# Patient Record
Sex: Male | Born: 1950 | Race: White | Hispanic: No | Marital: Single | Smoking: Current every day smoker
Health system: Southern US, Community
[De-identification: ages and names within clinical notes are randomized; demographics above are authoritative.]

## PROBLEM LIST (undated history)

## (undated) DIAGNOSIS — J189 Pneumonia, unspecified organism: Secondary | ICD-10-CM

## (undated) DIAGNOSIS — K4090 Unilateral inguinal hernia, without obstruction or gangrene, not specified as recurrent: Secondary | ICD-10-CM

## (undated) DIAGNOSIS — Z85828 Personal history of other malignant neoplasm of skin: Secondary | ICD-10-CM

## (undated) DIAGNOSIS — H539 Unspecified visual disturbance: Secondary | ICD-10-CM

## (undated) DIAGNOSIS — C4490 Unspecified malignant neoplasm of skin, unspecified: Secondary | ICD-10-CM

## (undated) DIAGNOSIS — I499 Cardiac arrhythmia, unspecified: Secondary | ICD-10-CM

## (undated) DIAGNOSIS — H5440 Blindness, one eye, unspecified eye: Secondary | ICD-10-CM

## (undated) DIAGNOSIS — M1990 Unspecified osteoarthritis, unspecified site: Secondary | ICD-10-CM

## (undated) HISTORY — DX: Unspecified visual disturbance: H53.9

## (undated) HISTORY — PX: TONSILLECTOMY: SUR1361

## (undated) HISTORY — DX: Unspecified malignant neoplasm of skin, unspecified: C44.90

## (undated) HISTORY — PX: EYE SURGERY: SHX253

---

## 1976-09-15 HISTORY — PX: KNEE CARTILAGE SURGERY: SHX688

## 2000-10-15 ENCOUNTER — Other Ambulatory Visit: Admission: RE | Admit: 2000-10-15 | Discharge: 2000-10-15 | Payer: Self-pay | Admitting: Internal Medicine

## 2000-10-15 ENCOUNTER — Encounter (INDEPENDENT_AMBULATORY_CARE_PROVIDER_SITE_OTHER): Payer: Self-pay | Admitting: Specialist

## 2003-12-26 ENCOUNTER — Ambulatory Visit (HOSPITAL_COMMUNITY): Admission: RE | Admit: 2003-12-26 | Discharge: 2003-12-26 | Payer: Self-pay | Admitting: Orthopedic Surgery

## 2004-10-14 ENCOUNTER — Ambulatory Visit: Payer: Self-pay | Admitting: Internal Medicine

## 2006-12-11 ENCOUNTER — Ambulatory Visit: Payer: Self-pay | Admitting: Internal Medicine

## 2006-12-11 LAB — CONVERTED CEMR LAB
ALT: 31 units/L (ref 0–40)
BUN: 18 mg/dL (ref 6–23)
Basophils Relative: 0.5 % (ref 0.0–1.0)
Bilirubin, Direct: 0.2 mg/dL (ref 0.0–0.3)
CO2: 31 meq/L (ref 19–32)
Chloride: 106 meq/L (ref 96–112)
GFR calc Af Amer: 99 mL/min
HDL: 41.5 mg/dL (ref >39.0)
Hemoglobin, Urine: NEGATIVE
Hemoglobin: 16.1 g/dL (ref 13.0–17.0)
LDL Cholesterol: 117 mg/dL — ABNORMAL HIGH (ref 0–99)
Lymphocytes Relative: 35.5 % (ref 12.0–46.0)
MCHC: 33.9 g/dL (ref 30.0–36.0)
MCV: 92.4 fL (ref 78.0–100.0)
Monocytes Absolute: 1 10*3/uL — ABNORMAL HIGH (ref 0.2–0.7)
Monocytes Relative: 14.2 % — ABNORMAL HIGH (ref 3.0–11.0)
Neutrophils Relative %: 49.4 % (ref 43.0–77.0)
Nitrite: NEGATIVE
PSA: 1.28 ng/mL (ref 0.10–4.00)
Platelets: 199 10*3/uL (ref 150–400)
RBC: 5.13 M/uL (ref 4.22–5.81)
RDW: 12.2 % (ref 11.5–14.6)
Sodium: 141 meq/L (ref 135–145)
Specific Gravity, Urine: 1.025 (ref 1.000–1.03)
Total Protein, Urine: NEGATIVE mg/dL
Total Protein: 7 g/dL (ref 6.0–8.3)
Triglycerides: 129 mg/dL (ref 0–149)
Urobilinogen, UA: 0.2 (ref 0.0–1.0)
pH: 5.5 (ref 5.0–8.0)

## 2006-12-18 ENCOUNTER — Ambulatory Visit: Payer: Self-pay | Admitting: Internal Medicine

## 2009-09-26 ENCOUNTER — Encounter (INDEPENDENT_AMBULATORY_CARE_PROVIDER_SITE_OTHER): Payer: Self-pay | Admitting: *Deleted

## 2010-04-12 ENCOUNTER — Telehealth: Payer: Self-pay | Admitting: Internal Medicine

## 2010-07-26 ENCOUNTER — Encounter (INDEPENDENT_AMBULATORY_CARE_PROVIDER_SITE_OTHER): Payer: Self-pay | Admitting: *Deleted

## 2010-08-01 ENCOUNTER — Encounter (INDEPENDENT_AMBULATORY_CARE_PROVIDER_SITE_OTHER): Payer: Self-pay | Admitting: *Deleted

## 2010-08-02 ENCOUNTER — Ambulatory Visit: Payer: Self-pay | Admitting: Internal Medicine

## 2010-08-13 ENCOUNTER — Ambulatory Visit: Payer: Self-pay | Admitting: Internal Medicine

## 2010-08-16 ENCOUNTER — Encounter: Payer: Self-pay | Admitting: Internal Medicine

## 2010-10-17 NOTE — Letter (Signed)
Summary: [facility] Instructions  [facility]  [address] [place], [place] [address]   Phone: [phone]  Fax: [phone]       Jamie York    December 04, 1950    MRN: [id number]        Procedure Day [name]:  Jamie York  08/13/10      Arrival Time:  3:00PM      Procedure Time:  4:00PM     Location of Procedure:                    _X _  [facility] (4th Floor)                      PREPARATION FOR COLONOSCOPY WITH MOVIPREP   Starting 5 days prior to your procedure 08/08/10 do not eat nuts, seeds, popcorn, corn, beans, peas,  salads, or any raw vegetables.  Do not take any fiber supplements (e.g. Metamucil, Citrucel, and Benefiber).  THE DAY BEFORE YOUR PROCEDURE         DATE: 08/12/10  DAY: MONDAY  1.  Drink clear liquids the entire day-NO SOLID FOOD  2.  Do not drink anything colored red or purple.  Avoid juices with pulp.  No orange juice.  3.  Drink at least 64 oz. (8 glasses) of fluid/clear liquids during the day to prevent dehydration and help the prep work efficiently.  CLEAR LIQUIDS INCLUDE: Water Jello Ice Popsicles Tea (sugar ok, no milk/cream) Powdered fruit flavored drinks Coffee (sugar ok, no milk/cream) Gatorade Juice: apple, white grape, white cranberry  Lemonade Clear bullion, consomm, broth Carbonated beverages (any kind) Strained chicken noodle soup Hard Candy                             4.  In the morning, mix first dose of MoviPrep solution:    Empty 1 Pouch A and 1 Pouch B into the disposable container    Add lukewarm drinking water to the top line of the container. Mix to dissolve    Refrigerate (mixed solution should be used within 24 hrs)  5.  Begin drinking the prep at 5:00 p.m. The MoviPrep container is divided by 4 marks.   Every 15 minutes drink the solution down to the next mark (approximately 8 oz) until the full liter is complete.   6.  Follow completed prep with 16 oz of clear liquid of your choice  (Nothing red or purple).  Continue to drink clear liquids until bedtime.  7.  Before going to bed, mix second dose of MoviPrep solution:    Empty 1 Pouch A and 1 Pouch B into the disposable container    Add lukewarm drinking water to the top line of the container. Mix to dissolve    Refrigerate  THE DAY OF YOUR PROCEDURE      DATE: 08/13/10  DAY: TUESDAY  Beginning at 11:00AM (5 hours before procedure):         1. Every 15 minutes, drink the solution down to the next mark (approx 8 oz) until the full liter is complete.  2. Follow completed prep with 16 oz. of clear liquid of your choice.    3. You may drink clear liquids until 2:00PM (2 HOURS BEFORE PROCEDURE).   MEDICATION INSTRUCTIONS  Unless otherwise instructed, you should take regular prescription medications with a small sip of water   as early as possible the morning  of your procedure.         OTHER INSTRUCTIONS  You will need a responsible adult at least 62 years of age to accompany you and drive you home.   This person must remain in the waiting room during your procedure.  Wear loose fitting clothing that is easily removed.  Leave jewelry and other valuables at home.  However, you may wish to bring a book to read or  an iPod/MP3 player to listen to music as you wait for your procedure to start.  Remove all body piercing jewelry and leave at home.  Total time from sign-in until discharge is approximately 2-3 hours.  You should go home directly after your procedure and rest.  You can resume normal activities the  day after your procedure.  The day of your procedure you should not:   Drive   Make legal decisions   Operate machinery   Drink alcohol   Return to work  You will receive specific instructions about eating, activities and medications before you leave.    The above instructions have been reviewed and explained to me by   Wyona Almas RN  August 02, 2010 11:04 AM     I fully  understand and can verbalize these instructions _____________________________ Date _________

## 2010-10-17 NOTE — Progress Notes (Signed)
Summary: Schedule Colonoscopy   Phone Note Outgoing Call Call back at Home Phone [phone]   Call placed by: Harlow Mares CMA Duncan Dull),  April 12, 2010 3:00 PM Call placed to: Patient Summary of Call: spoke to a child who said the pt was at work and the other adult is in the shower, I advised her I would call back later to speak to the pt. The patient is due for a colonoscopy, hyperplastic polyps 2002 Initial call taken by: Harlow Mares CMA Duncan Dull),  April 12, 2010 3:04 PM  Follow-up for Phone Call        patients wife answered the phone and she states that the patient work nights and he is sleeping and she will let him know when he wakes up that he is due and she will give him the number and tell him to know.  Follow-up by: Harlow Mares CMA Duncan Dull),  April 25, 2010 9:16 AM

## 2010-10-17 NOTE — Procedures (Signed)
Summary: Colonoscopy  Patient: Jamie York Note: All result statuses are Final unless otherwise noted.  Tests: (1) Colonoscopy (COL)   COL Colonoscopy           DONE     [facility]     [address]     [place], [place]  [address]           COLONOSCOPY PROCEDURE REPORT           PATIENT:  Jamie York, Jamie York  MR#:  [id number]     BIRTHDATE:  1950/12/01, 58 yrs. old  GENDER:  male     ENDOSCOPIST:  Wilhemina Bonito. Eda Keys, MD     REF. BY:  Screening Recall, M.D.     PROCEDURE DATE:  08/13/2010     PROCEDURE:  Colonoscopy with snare polypectomy x 1     ASA CLASS:  Class II     INDICATIONS:  Routine Risk Screening HPP on index exam 09-2000     MEDICATIONS:   Fentanyl 50 mcg IV, Versed 8 mg IV           DESCRIPTION OF PROCEDURE:   After the risks benefits and     alternatives of the procedure were thoroughly explained, informed     consent was obtained.  Digital rectal exam was performed and     revealed no abnormalities.   The LB 180AL [id number] endoscope was     introduced through the anus and advanced to the cecum, which was     identified by both the appendix and ileocecal valve, without     limitations.Time to cecum = 1:26 min. The quality of the prep was     excellent, using MoviPrep.  The instrument was then slowly     withdrawn (time = 9:06 min) as the colon was fully examined.     <<PROCEDUREIMAGES>>           FINDINGS:  A diminutive polyp was found in the rectum. Polyp was     snared without cautery. Retrieval was successful. snare polyp     This was otherwise a normal examination of the colon.   Retroflexed     views in the rectum revealed internal hemorrhoids.    The scope     was then withdrawn from the patient and the procedure completed.           COMPLICATIONS:  None     ENDOSCOPIC IMPRESSION:     1) Diminutive polyp in the rectum - removed     2) Otherwise normal examination     3) Internal hemorrhoids     RECOMMENDATIONS:     1) Repeat colonoscopy in 5 years  if polyp adenomatous; otherwise     10 years           ______________________________     Wilhemina Bonito. Eda Keys, MD           CC:  Corwin Levins, MD; The Patient           n.     eSIGNED:   Wilhemina Bonito. Eda Keys at 08/13/2010 04:11 PM           Enis Slipper, [id number]  Note: An exclamation mark (!) indicates a result that was not dispersed into the flowsheet. Document Creation Date: 08/13/2010 4:11 PM _______________________________________________________________________  (1) Order result status: Final Collection or observation date-time: 08/13/2010 16:07 Requested date-time:  Receipt date-time:  Reported date-time:  Referring Physician:   Ordering Physician: Jonny Ruiz  Eda Keys [phone]) Specimen Source:  Source: Launa Grill Order Number: [id number] Lab site:   Appended Document: Colonoscopy     Procedures Next Due Date:    Colonoscopy: 07/2020

## 2010-10-17 NOTE — Letter (Signed)
Summary: Pre Visit Letter Revised  [facility]  [address] [place], [place] [address]   Phone: [phone]  Fax: [phone]        07/26/2010 MRN: [id number] [facility] [address] [place], [place]  [address]             Procedure Date:  08/13/2010   Welcome to the Gastroenterology Division at [facility].    You are scheduled to see a nurse for your pre-procedure visit on 08/02/2010 at 10:30AM on the 3rd floor at [facility], [address]  We ask that you try to arrive at our office 15 minutes prior to your appointment time to allow for check-in.  Please take a minute to review the attached form.  If you answer "Yes" to one or more of the questions on the first page, we ask that you call the person listed at your earliest opportunity.  If you answer "No" to all of the questions, please complete the rest of the form and bring it to your appointment.    Your nurse visit will consist of discussing your medical and surgical history, your immediate family medical history, and your medications.   If you are unable to list all of your medications on the form, please bring the medication bottles to your appointment and we will list them.  We will need to be aware of both prescribed and over the counter drugs.  We will need to know exact dosage information as well.    Please be prepared to read and sign documents such as consent forms, a financial agreement, and acknowledgement forms.  If necessary, and with your consent, a friend or relative is welcome to sit-in on the nurse visit with you.  Please bring your insurance card so that we may make a copy of it.  If your insurance requires a referral to see a specialist, please bring your referral form from your primary care physician.  No co-pay is required for this nurse visit.     If you cannot keep your appointment, please call [phone] to cancel or reschedule prior to your appointment date.  This  allows [place] the opportunity to schedule an appointment for another patient in need of care.    Thank you for choosing [facility] for your medical needs.  We appreciate the opportunity to care for you.  Please visit [place] at our website  to learn more about our practice.  Sincerely, The Gastroenterology Division

## 2010-10-17 NOTE — Miscellaneous (Signed)
Summary: [facility] Previsit/prep  Clinical Lists Changes  Medications: Added new medication of MOVIPREP 100 GM  SOLR (PEG-KCL-NACL-NASULF-NA ASC-C) As per prep instructions. - Signed Rx of MOVIPREP 100 GM  SOLR (PEG-KCL-NACL-NASULF-NA ASC-C) As per prep instructions.;  #1 x 0;  Signed;  Entered by: Wyona Almas RN;  Authorized by: Hilarie Fredrickson MD;  Method used: Electronically to [facility] [phone]*, [address] [place], [place]  [address], Ph: [phone] or [phone], Fax: [phone] Observations: Added new observation of NKA: T (08/02/2010 10:31)    Prescriptions: MOVIPREP 100 GM  SOLR (PEG-KCL-NACL-NASULF-NA ASC-C) As per prep instructions.  #1 x 0   Entered by:   Wyona Almas RN   Authorized by:   Hilarie Fredrickson MD   Signed by:   Wyona Almas RN on 08/02/2010   Method used:   [facility] to        [facility] [phone][facility] (retail)       [address]       [place], [place]  [address]       Ph: [phone] or [phone]       Fax: [phone]   RxID:   [id number]   Appended Document: Colonoscopy     Procedures Next Due Date:    Colonoscopy: 07/2020

## 2010-10-17 NOTE — Letter (Signed)
Summary: Colonoscopy Letter  [facility]  [address] [place], [place] [address]   Phone: [phone]  Fax: [phone]      September 26, 2009 MRN: [id number]   Jamie York [address] [place], [place]  [address]   Dear [name],   According to your medical record, it is time for you to schedule a Colonoscopy. The American Cancer Society recommends this procedure as a method to detect early colon cancer. Patients with a family history of colon cancer, or a personal history of colon polyps or inflammatory bowel disease are at increased risk.  This letter has beeen generated based on the recommendations made at the time of your procedure. If you feel that in your particular situation this may no longer apply, please contact our office.  Please call our office at [phone] to schedule this appointment or to update your records at your earliest convenience.  Thank you for cooperating with [place] to provide you with the very best care possible.   Sincerely,  Wilhemina Bonito. Marina Goodell, M.D.  [facility] Gastroenterology Division [phone]

## 2010-10-17 NOTE — Letter (Signed)
Summary: Patient Notice- Polyp Results  [facility]  [address] [place], [place] [address]   Phone: [phone]  Fax: [phone]        August 16, 2010 MRN: [id number]    Jamie York [address] [place], [place]  [address]    Dear Mr. Jamie York,  I am pleased to inform you that the colon polyp(s) removed during your recent colonoscopy was (were) found to be benign (no cancer detected) upon pathologic examination.  I recommend you have a repeat colonoscopy examination in 10 years to look for recurrent polyps, as having colon polyps increases your risk for having recurrent polyps or even colon cancer in the future.  Should you develop new or worsening symptoms of abdominal pain, bowel habit changes or bleeding from the rectum or bowels, please schedule an evaluation with either your primary care physician or with me.  Additional information/recommendations:  __ No further action with gastroenterology is needed at this time. Please      follow-up with your primary care physician for your other healthcare      needs.    Please call us if you are having persistent problems or have questions about your condition that have not been fully answered at this time.  Sincerely,  Hilarie Fredrickson MD  This letter has been electronically signed by your physician.  Appended Document: Patient Notice- Polyp Results Letter mailed

## 2011-09-16 DIAGNOSIS — C4490 Unspecified malignant neoplasm of skin, unspecified: Secondary | ICD-10-CM

## 2011-09-16 HISTORY — DX: Unspecified malignant neoplasm of skin, unspecified: C44.90

## 2012-08-11 ENCOUNTER — Other Ambulatory Visit: Payer: Self-pay | Admitting: Dermatology

## 2012-08-31 DIAGNOSIS — C44310 Basal cell carcinoma of skin of unspecified parts of face: Secondary | ICD-10-CM | POA: Insufficient documentation

## 2012-09-15 HISTORY — PX: ROTATOR CUFF REPAIR: SHX139

## 2013-04-01 DIAGNOSIS — C760 Malignant neoplasm of head, face and neck: Secondary | ICD-10-CM | POA: Insufficient documentation

## 2014-08-20 ENCOUNTER — Encounter: Payer: Self-pay | Admitting: *Deleted

## 2015-04-05 ENCOUNTER — Ambulatory Visit (INDEPENDENT_AMBULATORY_CARE_PROVIDER_SITE_OTHER): Payer: BLUE CROSS/BLUE SHIELD

## 2015-04-05 ENCOUNTER — Encounter: Payer: Self-pay | Admitting: Family Medicine

## 2015-04-05 ENCOUNTER — Ambulatory Visit (INDEPENDENT_AMBULATORY_CARE_PROVIDER_SITE_OTHER): Payer: BLUE CROSS/BLUE SHIELD | Admitting: Family Medicine

## 2015-04-05 VITALS — BP 118/78 | HR 86 | Ht 71.0 in | Wt 176.0 lb

## 2015-04-05 DIAGNOSIS — Z720 Tobacco use: Secondary | ICD-10-CM

## 2015-04-05 DIAGNOSIS — F17200 Nicotine dependence, unspecified, uncomplicated: Secondary | ICD-10-CM

## 2015-04-05 DIAGNOSIS — M21931 Unspecified acquired deformity of right forearm: Secondary | ICD-10-CM | POA: Diagnosis not present

## 2015-04-05 DIAGNOSIS — M79641 Pain in right hand: Secondary | ICD-10-CM

## 2015-04-05 DIAGNOSIS — M25531 Pain in right wrist: Secondary | ICD-10-CM

## 2015-04-05 DIAGNOSIS — R432 Parageusia: Secondary | ICD-10-CM | POA: Diagnosis not present

## 2015-04-05 DIAGNOSIS — H5440 Blindness, one eye, unspecified eye: Secondary | ICD-10-CM | POA: Insufficient documentation

## 2015-04-05 DIAGNOSIS — Z23 Encounter for immunization: Secondary | ICD-10-CM | POA: Diagnosis not present

## 2015-04-05 NOTE — Progress Notes (Signed)
Jamie York is a 64 y.o. male who presents to [facility]  today for establish care and discuss several acute problems. 1) right hand pain. Patient has several year history of pain in the right hand. The pain is located predominantly in the right second MCP. He notes decreased flexion of this joint and chronic constant pain. He denies any injury. He additionally notes a nodule on the dorsal aspect of his left wrist at the distal radius. This is not particular tender but is present present for years as well. No injury. He takes naproxen for pain intermittently.  2) decreased taste. Patient notes a several month history of decreasing taste. He denies any change in smell. He denies any other neurological symptoms such as weakness or numbness or loss of function. No fevers chills nausea vomiting or diarrhea. He feels well otherwise.  3) smoker: Patient is a chronic daily smoker. He wishes to quit smoking with his wife. He has quit successfully in the past.  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   History reviewed. No pertinent past medical history. Past Surgical History  Procedure Laterality Date  . Tonsillectomy     History  Substance Use Topics  . Smoking status: Current Every Day Smoker  . Smokeless tobacco: Not on file  . Alcohol Use: Yes   ROS as above Medications: Current Outpatient Prescriptions  Medication Sig Dispense Refill  . HYDROcodone-acetaminophen (NORCO) 10-325 MG per tablet Take 1 tablet by mouth every 6 (six) hours as needed.    . naproxen (NAPROSYN) 250 MG tablet Take by mouth 2 (two) times daily with a meal.    . PARoxetine (PAXIL-CR) 25 MG 24 hr tablet Take 25 mg by mouth daily.     No current facility-administered medications for this visit.   No Known Allergies   Exam:  BP 118/78 mmHg  Pulse 86  Ht 5\' 11"  (1.803 m)  Wt 176 lb (79.833 kg)  BMI 24.56  kg/m2 Gen: Well NAD HEENT: EOMI,  MMM  lack of full motion of the left eye. No oropharynx findings that are abnormal Lungs: Normal work of breathing. CTABL Heart: RRR no MRG Abd: NABS, Soft. Nondistended, Nontender Exts: Brisk capillary refill, warm and well perfused.  Right hand and wrist: Right wrist nontender non-mobile nodule distal radius dorsal aspect Right hand mildly swollen and tender second MCP. No synovitis. Decreased flexion normal extension. Pulses capillary refill sensation and grip of the hand and wrist are normal.  No results found for this or any previous visit (from the past 24 hour(s)). No results found.   Please see individual assessment and plan sections.

## 2015-04-05 NOTE — Patient Instructions (Signed)
Thank you for coming in today. Come back for your hand and smoking.  Smoking Cessation Quitting smoking is important to your health and has many advantages. However, it is not always easy to quit since nicotine is a very addictive drug. Oftentimes, people try 3 times or more before being able to quit. This document explains the best ways for you to prepare to quit smoking. Quitting takes hard work and a lot of effort, but you can do it. ADVANTAGES OF QUITTING SMOKING  You will live longer, feel better, and live better.  Your body will feel the impact of quitting smoking almost immediately.  Within 20 minutes, blood pressure decreases. Your pulse returns to its normal level.  After 8 hours, carbon monoxide levels in the blood return to normal. Your oxygen level increases.  After 24 hours, the chance of having a heart attack starts to decrease. Your breath, hair, and body stop smelling like smoke.  After 48 hours, damaged nerve endings begin to recover. Your sense of taste and smell improve.  After 72 hours, the body is virtually free of nicotine. Your bronchial tubes relax and breathing becomes easier.  After 2 to 12 weeks, lungs can hold more air. Exercise becomes easier and circulation improves.  The risk of having a heart attack, stroke, cancer, or lung disease is greatly reduced.  After 1 year, the risk of coronary heart disease is cut in half.  After 5 years, the risk of stroke falls to the same as a nonsmoker.  After 10 years, the risk of lung cancer is cut in half and the risk of other cancers decreases significantly.  After 15 years, the risk of coronary heart disease drops, usually to the level of a nonsmoker.  If you are pregnant, quitting smoking will improve your chances of having a healthy baby.  The people you live with, especially any children, will be healthier.  You will have extra money to spend on things other than cigarettes. QUESTIONS TO THINK ABOUT BEFORE  ATTEMPTING TO QUIT You may want to talk about your answers with your health care provider.  Why do you want to quit?  If you tried to quit in the past, what helped and what did not?  What will be the most difficult situations for you after you quit? How will you plan to handle them?  Who can help you through the tough times? Your family? Friends? A health care provider?  What pleasures do you get from smoking? What ways can you still get pleasure if you quit? Here are some questions to ask your health care provider:  How can you help me to be successful at quitting?  What medicine do you think would be best for me and how should I take it?  What should I do if I need more help?  What is smoking withdrawal like? How can I get information on withdrawal? GET READY  Set a quit date.  Change your environment by getting rid of all cigarettes, ashtrays, matches, and lighters in your home, car, or work. Do not let people smoke in your home.  Review your past attempts to quit. Think about what worked and what did not. GET SUPPORT AND ENCOURAGEMENT You have a better chance of being successful if you have help. You can get support in many ways.  Tell your family, friends, and coworkers that you are going to quit and need their support. Ask them not to smoke around you.  Get individual, group, or  telephone counseling and support. Programs are available at [employer] and health centers. Call your local health department for information about programs in your area.  Spiritual beliefs and practices may help some smokers quit.  Download a "quit meter" on your computer to keep track of quit statistics, such as how long you have gone without smoking, cigarettes not smoked, and money saved.  Get a self-help book about quitting smoking and staying off tobacco. [place] yourself from urges to smoke. Talk to someone, go for a walk, or occupy your time with a  task.  Change your normal routine. Take a different route to work. Drink tea instead of coffee. Eat breakfast in a different place.  Reduce your stress. Take a hot bath, exercise, or read a book.  Plan something enjoyable to do every day. Reward yourself for not smoking.  Explore interactive web-based programs that specialize in helping you quit. GET MEDICINE AND USE IT CORRECTLY Medicines can help you stop smoking and decrease the urge to smoke. Combining medicine with the above behavioral methods and support can greatly increase your chances of successfully quitting smoking.  Nicotine replacement therapy helps deliver nicotine to your body without the negative effects and risks of smoking. Nicotine replacement therapy includes nicotine gum, lozenges, inhalers, nasal sprays, and skin patches. Some may be available over-the-counter and others require a prescription.  Antidepressant medicine helps people abstain from smoking, but how this works is unknown. This medicine is available by prescription.  Nicotinic receptor partial agonist medicine simulates the effect of nicotine in your brain. This medicine is available by prescription. Ask your health care provider for advice about which medicines to use and how to use them based on your health history. Your health care provider will tell you what side effects to look out for if you choose to be on a medicine or therapy. Carefully read the information on the package. Do not use any other product containing nicotine while using a nicotine replacement product.  RELAPSE OR DIFFICULT SITUATIONS Most relapses occur within the first 3 months after quitting. Do not be discouraged if you start smoking again. Remember, most people try several times before finally quitting. You may have symptoms of withdrawal because your body is used to nicotine. You may crave cigarettes, be irritable, feel very hungry, cough often, get headaches, or have difficulty  concentrating. The withdrawal symptoms are only temporary. They are strongest when you first quit, but they will go away within 10-14 days. To reduce the chances of relapse, try to:  Avoid drinking alcohol. Drinking lowers your chances of successfully quitting.  Reduce the amount of caffeine you consume. Once you quit smoking, the amount of caffeine in your body increases and can give you symptoms, such as a rapid heartbeat, sweating, and anxiety.  Avoid smokers because they can make you want to smoke.  Do not let weight gain distract you. Many smokers will gain weight when they quit, usually less than 10 pounds. Eat a healthy diet and stay active. You can always lose the weight gained after you quit.  Find ways to improve your mood other than smoking. FOR MORE INFORMATION  www.smokefree.gov  Document Released: 08/26/2001 Document Revised: 01/16/2014 Document Reviewed: 12/11/2011 [facility] Patient Information 2015 [place], [place]. This information is not intended to replace advice given to you by your health care provider. Make sure you discuss any questions you have with your health care provider.

## 2015-04-05 NOTE — Assessment & Plan Note (Signed)
Unclear etiology. Refer to neurology.

## 2015-04-05 NOTE — Assessment & Plan Note (Signed)
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 I suspect he has [place] and arthritis of the distal radius as well. We'll obtain x-rays of the hand and wrist. He may benefit from ultrasound guided steroid injection into the second MCP.

## 2015-04-05 NOTE — Assessment & Plan Note (Addendum)
Work on smoking cessation counseling in the next visit with wife. Screening labs today.

## 2015-04-11 ENCOUNTER — Encounter: Payer: Self-pay | Admitting: Family Medicine

## 2015-04-11 ENCOUNTER — Ambulatory Visit (INDEPENDENT_AMBULATORY_CARE_PROVIDER_SITE_OTHER): Payer: BLUE CROSS/BLUE SHIELD | Admitting: Family Medicine

## 2015-04-11 VITALS — BP 124/66 | HR 62 | Wt 172.0 lb

## 2015-04-11 DIAGNOSIS — M79641 Pain in right hand: Secondary | ICD-10-CM

## 2015-04-11 NOTE — Assessment & Plan Note (Signed)
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  Return PRN.

## 2015-04-11 NOTE — Patient Instructions (Signed)
Thank you for coming in today.   Call or go to the ER if you develop a large red swollen joint with extreme pain or oozing puss.   

## 2015-04-11 NOTE — Progress Notes (Signed)
Jamie York is a 64 y.o. male who presents to [facility]  today for follow-up hand pain. Patient continues to have significant right hand pain especially at the second MCP. 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 The wrist does not bother him much. He has chronic daily pain in his right hand. He has tried conservative measures and would like an injection today if able.   No past medical history on file. Past Surgical History  Procedure Laterality Date  . Tonsillectomy     History  Substance Use Topics  . Smoking status: Current Every Day Smoker  . Smokeless tobacco: Not on file  . Alcohol Use: Yes   ROS as above Medications: Current Outpatient Prescriptions  Medication Sig Dispense Refill  . HYDROcodone-acetaminophen (NORCO) 10-325 MG per tablet Take 1 tablet by mouth every 6 (six) hours as needed.    . naproxen (NAPROSYN) 250 MG tablet Take by mouth 2 (two) times daily with a meal.    . PARoxetine (PAXIL-CR) 25 MG 24 hr tablet Take 25 mg by mouth daily.     No current facility-administered medications for this visit.   No Known Allergies   Exam:  BP 124/66 mmHg  Pulse 62  Wt 172 lb (78.019 kg) Gen: Well NAD HEENT: EOMI,  MMM  Exts: Brisk capillary refill, warm and well perfused.  Right hand mildly swollen and tender second MCP. No synovitis. Decreased flexion normal extension. Pulses capillary refill sensation and grip of the hand and wrist are normal.  Procedure: Real-time Ultrasound Guided Injection of right 2nd MCP.   Device: GE Logiq E  Verbal informed consent obtained. Discussed risks and benefits of procedure. Warned about infection bleeding damage to structures skin hypopigmentation and fat atrophy among others. Patient expresses understanding and agreement Time-out conducted.  Noted no overlying erythema, induration, or other signs of local infection.   Skin prepped in a sterile fashion.  Local anesthesia: Topical Ethyl chloride.  With sterile technique and under real time ultrasound guidance: 10mg  Kenalog and 0.51ml marcaine injected easily.  Completed without difficulty  Pain immediately resolved suggesting accurate placement of the medication.  Advised to call if fevers/chills, erythema, induration, drainage, or persistent bleeding.  Images permanently stored and available for review in the ultrasound unit.  Impression: Technically successful ultrasound guided injection.   No results found for this or any previous visit (from the past 24 hour(s)). No results found.   Please see individual assessment and plan sections.

## 2015-04-12 ENCOUNTER — Telehealth: Payer: Self-pay | Admitting: Family Medicine

## 2015-04-12 LAB — COMPLETE METABOLIC PANEL WITH GFR
ALT: 23 U/L (ref 9–46)
AST: 22 U/L (ref 10–35)
Albumin: 4 g/dL (ref 3.6–5.1)
Alkaline Phosphatase: 63 U/L (ref 40–115)
BILIRUBIN TOTAL: 1.3 mg/dL — AB (ref 0.2–1.2)
BUN: 16 mg/dL (ref 7–25)
CHLORIDE: 110 meq/L (ref 98–110)
CO2: 18 mEq/L — ABNORMAL LOW (ref 20–31)
CREATININE: 0.93 mg/dL (ref 0.70–1.25)
Calcium: 9 mg/dL (ref 8.6–10.3)
GFR, Est African American: >89 mL/min (ref >=60)
GFR, Est Non African American: 86 mL/min (ref >=60)
Glucose, Bld: 103 mg/dL — ABNORMAL HIGH (ref 65–99)
POTASSIUM: 4.2 meq/L (ref 3.5–5.3)
SODIUM: 141 meq/L (ref 135–146)
Total Protein: 6.4 g/dL (ref 6.1–8.1)

## 2015-04-12 LAB — CBC
HCT: 50.7 % (ref 39.0–52.0)
HEMOGLOBIN: 17.3 g/dL — AB (ref 13.0–17.0)
MCH: 33.5 pg (ref 26.0–34.0)
MCHC: 34.1 g/dL (ref 30.0–36.0)
MCV: 98.1 fL (ref 78.0–100.0)
MPV: 10.9 fL (ref 8.6–12.4)
Platelets: 161 10*3/uL (ref 150–400)
RBC: 5.17 MIL/uL (ref 4.22–5.81)
RDW: 14 % (ref 11.5–15.5)
WBC: 7.7 10*3/uL (ref 4.0–10.5)

## 2015-04-12 LAB — LIPID PANEL
CHOL/HDL RATIO: 3 ratio (ref <=5.0)
CHOLESTEROL: 159 mg/dL (ref 125–200)
HDL: 53 mg/dL (ref >=40)
LDL Cholesterol: 88 mg/dL (ref <130)
TRIGLYCERIDES: 89 mg/dL (ref <150)
VLDL: 18 mg/dL (ref <30)

## 2015-04-12 LAB — HEPATITIS C ANTIBODY: HCV AB: NEGATIVE

## 2015-04-12 MED ORDER — ATORVASTATIN CALCIUM 10 MG PO TABS: 10.0000 mg | ORAL_TABLET | Freq: Every day | ORAL | Status: DC

## 2015-04-12 NOTE — Telephone Encounter (Signed)
Cholesterol is normal but smoking and age increases risk enough that the recommendation is to start a statin.  Lipitor RX.  CMA will contact pt.

## 2015-04-12 NOTE — Telephone Encounter (Signed)
Left detailed vm with results and recommendations. Requested a call back with any questions.

## 2015-06-07 ENCOUNTER — Ambulatory Visit (INDEPENDENT_AMBULATORY_CARE_PROVIDER_SITE_OTHER): Payer: BLUE CROSS/BLUE SHIELD | Admitting: Family Medicine

## 2015-06-07 ENCOUNTER — Encounter: Payer: Self-pay | Admitting: Family Medicine

## 2015-06-07 VITALS — BP 131/66 | HR 53 | Wt 177.0 lb

## 2015-06-07 DIAGNOSIS — K4090 Unilateral inguinal hernia, without obstruction or gangrene, not specified as recurrent: Secondary | ICD-10-CM | POA: Diagnosis not present

## 2015-06-07 DIAGNOSIS — R079 Chest pain, unspecified: Secondary | ICD-10-CM

## 2015-06-07 DIAGNOSIS — K4020 Bilateral inguinal hernia, without obstruction or gangrene, not specified as recurrent: Secondary | ICD-10-CM | POA: Insufficient documentation

## 2015-06-07 NOTE — Patient Instructions (Signed)
I will call you tomorrow with times a places.  Call or go to the emergency room if you get worse, have trouble breathing, have chest pains, or palpitations.   Hernia A hernia occurs when an internal organ pushes out through a weak spot in the abdominal wall. Hernias most commonly occur in the groin and around the navel. Hernias often can be pushed back into place (reduced). Most hernias tend to get worse over time. Some abdominal hernias can get stuck in the opening (irreducible or incarcerated hernia) and cannot be reduced. An irreducible abdominal hernia which is tightly squeezed into the opening is at risk for impaired blood supply (strangulated hernia). A strangulated hernia is a medical emergency. Because of the risk for an irreducible or strangulated hernia, surgery may be recommended to repair a hernia. CAUSES   Heavy lifting.  Prolonged coughing.  Straining to have a bowel movement.  A cut (incision) made during an abdominal surgery. HOME CARE INSTRUCTIONS   Bed rest is not required. You may continue your normal activities.  Avoid lifting more than 10 pounds (4.5 kg) or straining.  Cough gently. If you are a smoker it is best to stop. Even the best hernia repair can break down with the continual strain of coughing. Even if you do not have your hernia repaired, a cough will continue to aggravate the problem.  Do not wear anything tight over your hernia. Do not try to keep it in with an outside bandage or truss. These can damage abdominal contents if they are trapped within the hernia sac.  Eat a normal diet.  Avoid constipation. Straining over long periods of time will increase hernia size and encourage breakdown of repairs. If you cannot do this with diet alone, stool softeners may be used. SEEK IMMEDIATE MEDICAL CARE IF:   You have a fever.  You develop increasing abdominal pain.  You feel nauseous or vomit.  Your hernia is stuck outside the abdomen, looks discolored,  feels hard, or is tender.  You have any changes in your bowel habits or in the hernia that are unusual for you.  You have increased pain or swelling around the hernia.  You cannot push the hernia back in place by applying gentle pressure while lying down. MAKE SURE YOU:   Understand these instructions.  Will watch your condition.  Will get help right away if you are not doing well or get worse. Document Released: 09/01/2005 Document Revised: 11/24/2011 Document Reviewed: 04/20/2008 [facility] Patient Information 2015 [place], [place]. This information is not intended to replace advice given to you by your health care provider. Make sure you discuss any questions you have with your health care provider.

## 2015-06-07 NOTE — Assessment & Plan Note (Signed)
Not yet incarcerated. Refer to general surgery. Discussed  with patient backup plan if the hernia becomes incarcerated.

## 2015-06-07 NOTE — Progress Notes (Signed)
Jamie York is a 64 y.o. male who presents to [facility]  today for inguinal hernia. For the last 6 weeks pain is has noted bulging and pain in his right inguinal area. This occurred after he lifted a heavy object at work. The last week he's noted that is worsened with worsening bulging and pain. He is able to reduce the herniation himself most of the time. He denies any fevers or chills.  Last week he had one episode of 20 minutes of mild chest pain rating to the left arm. This lasted about 20 minutes. He notes the pain was never exertional and never associated with shortness of breath palpitations or neck pain. He notes that about a year and a half ago he had a nuclear stress test at [facility] heart and vascular that was reportedly normal for prior authorization for hand surgery.   No past medical history on file. Past Surgical History  Procedure Laterality Date  . Tonsillectomy     Social History  Substance Use Topics  . Smoking status: Current Every Day Smoker  . Smokeless tobacco: Not on file  . Alcohol Use: Yes   family history is not on file.  ROS as above Medications: Current Outpatient Prescriptions  Medication Sig Dispense Refill  . atorvastatin (LIPITOR) 10 MG tablet Take 1 tablet (10 mg total) by mouth daily. 90 tablet 3  . HYDROcodone-acetaminophen (NORCO) 10-325 MG per tablet Take 1 tablet by mouth every 6 (six) hours as needed.    . naproxen (NAPROSYN) 250 MG tablet Take by mouth 2 (two) times daily with a meal.    . PARoxetine (PAXIL-CR) 25 MG 24 hr tablet Take 25 mg by mouth daily.     No current facility-administered medications for this visit.   No Known Allergies   Exam:  BP 131/66 mmHg  Pulse 53  Wt 177 lb (80.287 kg) Gen: Well NAD HEENT: EOMI,  MMM Lungs: Normal work of breathing. CTABL Heart: RRR no MRG Abd: NABS, Soft. Nondistended, Nontender Exts: Brisk capillary refill, warm and well perfused.  Groin: Right  reducible inguinal hernia not into the scrotum.  Twelve-lead EKG shows sinus rhythm with significant frequent PVCs. He has lateral T-wave inversions but no greater than 1 mm ST segment elevation or depression. Normal axis.  No prior EKGs to compare to however patient notes that he's had similar EKGs in the past.  No results found for this or any previous visit (from the past 24 hour(s)). No results found.   Please see individual assessment and plan sections.

## 2015-06-07 NOTE — Assessment & Plan Note (Addendum)
Unclear etiology. Likely not cardiac related however patient has a significantly abnormal EKG. Refer patient urgently to cardiology for evaluation of his chest pain as well as his abnormal EKG and for cardiac clearance if needed. I am fearful his hernia will become incarcerated soon and he will likely need surgery sooner rather than later and would like to expedite cardiac clearance

## 2015-06-08 ENCOUNTER — Telehealth: Payer: Self-pay | Admitting: Family Medicine

## 2015-06-08 NOTE — Telephone Encounter (Signed)
Dr. Georgina Snell, [place] from [facility] called to let you know that Jamie York's appt has been scheduled for 9/27 @ 4:15. Patient is also aware of his appt.  Thank you.

## 2015-06-12 ENCOUNTER — Ambulatory Visit (INDEPENDENT_AMBULATORY_CARE_PROVIDER_SITE_OTHER): Payer: BLUE CROSS/BLUE SHIELD | Admitting: Internal Medicine

## 2015-06-12 ENCOUNTER — Encounter: Payer: Self-pay | Admitting: Internal Medicine

## 2015-06-12 VITALS — BP 112/62 | HR 87 | Ht 71.0 in | Wt 176.7 lb

## 2015-06-12 DIAGNOSIS — Z01810 Encounter for preprocedural cardiovascular examination: Secondary | ICD-10-CM | POA: Diagnosis not present

## 2015-06-12 DIAGNOSIS — Z720 Tobacco use: Secondary | ICD-10-CM

## 2015-06-12 DIAGNOSIS — I493 Ventricular premature depolarization: Secondary | ICD-10-CM | POA: Insufficient documentation

## 2015-06-12 DIAGNOSIS — F17200 Nicotine dependence, unspecified, uncomplicated: Secondary | ICD-10-CM

## 2015-06-12 MED ORDER — METOPROLOL SUCCINATE ER 25 MG PO TB24: 12.5000 mg | ORAL_TABLET | Freq: Every day | ORAL | Status: DC

## 2015-06-12 NOTE — Patient Instructions (Signed)
Dr. Debara Pickett has prescribed METOPROLOL SUCCINATE (Toprol XL) 12.5mg   >> take 12.5mg  (one half of a 25mg  tablet) once daily 2 days prior to surgery and 2 days after surgery   Your physician wants you to follow-up in: 1 year with Dr. Debara Pickett.  You will receive a reminder letter in the mail two months in advance. If you don't receive a letter, please call our office to schedule the follow-up appointment.

## 2015-06-12 NOTE — Progress Notes (Signed)
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 He recently saw his primary care provider and has been complaining of a right inguinal hernia. He is contemplating surgery and has been seen by Dr. gross. Recently he had an EKG which showed significant number of PVCs. He is unaware of these PVCs and denies any palpitations. In fact he denies any chest pain or shortness of breath. He is physically active and appears to be in decent cardiovascular condition. He is able to do more than 4 metabolic equivalents of activity without any chest pain. He apparently has had a history of PVCs in the past and underwent a workup by Dr. Einar Gip with [facility] cardiology about a year and a half ago. Apparently he had a nuclear stress test which was negative. He subsequently underwent another surgery at that time without any problems.  PMHx:  Past Medical History  Diagnosis Date  . Skin cancer 2013  . 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 Tonsillectomy    . Rotator cuff repair  2014  . Knee cartilage surgery  1978  . Eye surgery  left eye    FAMHx:  Family History  Problem Relation Age of Onset  . Cancer - Colon Mother     SOCHx:   reports that he has been smoking.  He does not have any smokeless tobacco history on file. He reports that he drinks about 4.2 oz of alcohol per week. He reports that he uses illicit drugs (Hydrocodone).  ALLERGIES:  No Known Allergies  ROS: A comprehensive review of systems was negative except for: Musculoskeletal: positive for Right inguinal hernia  HOME MEDS: Current Outpatient Prescriptions  Medication Sig Dispense Refill  . HYDROcodone-acetaminophen (NORCO) 10-325 MG per tablet Take 1 tablet by mouth every 6 (six) hours as needed.    . metoprolol  succinate (TOPROL-XL) 25 MG 24 hr tablet Take 0.5 tablets (12.5 mg total) by mouth daily. 10 tablet 0  . naproxen (NAPROSYN) 250 MG tablet Take by mouth 2 (two) times daily with a meal.    . PARoxetine (PAXIL-CR) 25 MG 24 hr tablet Take 25 mg by mouth daily.     No current facility-administered medications for this visit.    LABS/IMAGING: No results found for this or any previous visit (from the past 48 hour(s)). No results found.  WEIGHTS: Wt Readings from Last 3 Encounters:  06/12/15 176 lb 11.2 oz (80.151 kg)  06/07/15 177 lb (80.287 kg)  04/11/15 172 lb (78.019 kg)    VITALS: BP 112/62 mmHg  Pulse 87  Ht 5\' 11"  (1.803 m)  Wt 176 lb 11.2 oz (80.151 kg)  BMI 24.66 kg/m2  EXAM: General appearance: alert and no distress Neck: no carotid bruit and no JVD Lungs: clear to auscultation bilaterally Heart: regular rate and rhythm, S1, S2 normal, no murmur, click, rub or gallop Abdomen: soft, non-tender; bowel sounds normal; no masses,  no organomegaly Extremities: extremities normal, atraumatic, no cyanosis or edema Pulses: 2+ and symmetric Skin: Skin color, texture, turgor normal. No rashes or lesions Neurologic: Grossly normal Psych: Pleasant  EKG: Normal sinus rhythm at 87, frequent PVCs, right bundle branch block  ASSESSMENT: 1. Frequent PVCs and a quadrigeminy pattern 2. Low risk for surgery  PLAN: 1.   Mr. Want had  a stress test about a year and a half ago by Dr. Einar Gip per his report and will go ahead and request those results. He's had no symptoms such as chest pain or shortness of breath and he is a unaware of his PVCs. This could suggest that these PVCs are benign although they are right bundle branch morphology therefore likely arises from the left ventricle. I've advised him that it may be reasonable to consider perioperative beta blockade. We'll go ahead and prescribe him with low-dose Toprol-XL 12.5 mg to take 2 days prior to surgery and up to 2 days after. I do  think that he is low risk for surgery and can likely undergo hernia repair without difficulty.  I'm happy see him back as needed or on an annual basis if he prefers.  Thanks for the kind referral.  Pixie Casino, MD, [facility] Attending Cardiologist [place] 06/12/2015, 5:18 PM

## 2015-06-14 NOTE — Addendum Note (Signed)
Addended by: Huel Cote on: 06/14/2015 04:21 PM   Modules accepted: Orders

## 2015-06-19 ENCOUNTER — Other Ambulatory Visit: Payer: Self-pay | Admitting: Surgery

## 2015-06-19 NOTE — H&P (Signed)
Marrianne Mood. Sultana 06/19/2015 4:00 PM Location: [facility] Patient #: [id number] DOB: June 02, 1951 Separated / Language: Cleophus Molt / Race: White Male History of Present Illness Adin Hector MD; 06/19/2015 5:25 PM) The patient is a 64 year old male who presents with an inguinal hernia. Patient sent for surgical consultation by Dr. Georgina Snell for concern of RIGHT inguinal hernia. Pleasant active male. Occasionally smokes cigars. Some intermittent chest pain felt to be noncardiac followed by cardiology. Artery cleared by them. Today with his fiance. Noted some intermittent RIGHT groin discomfort in the past year but noticed a definite bulge a little over a month ago. It is moving down towards the groin. Reducible. He works as a Electrical engineer with moderate activity. Tries to void severe heavy lifting. No prior surgeries. Normally can walk a half hour without difficulty. Has a bowel movement every day. No fevers or chills. Energy level and appetite pretty good. No skin abscesses or MRSA. Because of concern of the RIGHT groin swelling the patient and his fiance discussed with Dr. Georgina Snell. Concern for inguinal hernia. Surgical consultation requested. Other Problems Elbert Ewings, CMA; 06/19/2015 4:00 PM) No pertinent past medical history  Past Surgical History Elbert Ewings, CMA; 06/19/2015 4:00 PM) Knee Surgery Right. Shoulder Surgery Left.  Diagnostic Studies History Elbert Ewings, [place]; 06/19/2015 4:00 PM) Colonoscopy 1-5 years ago  Allergies Elbert Ewings, CMA; 06/19/2015 4:00 PM) No Known Drug Allergies 06/19/2015  Medication History Elbert Ewings, CMA; 06/19/2015 4:01 PM) Hydrocodone-Acetaminophen (10-325MG  Tablet, Oral) Active. Naproxen (250MG  Tablet, Oral) Active. Paxil CR (25MG  Tablet ER 24HR, Oral) Active. Metoprolol Succinate ER (25MG  Tablet ER 24HR, Oral starting next week) Active. Medications Reconciled  Social History Elbert Ewings, [place]; 06/19/2015 4:00  PM) Alcohol use Occasional alcohol use. Caffeine use Coffee. Illicit drug use Remotely quit drug use. Tobacco use Current every day smoker.  Family History Elbert Ewings, [place]; 06/19/2015 4:00 PM) Colon Cancer Mother.     Review of Systems Elbert Ewings CMA; 06/19/2015 4:00 PM) General Not Present- Appetite Loss, Chills, Fatigue, Fever, Night Sweats, Weight Gain and Weight Loss. Skin Not Present- Change in Wart/Mole, Dryness, Hives, Jaundice, New Lesions, Non-Healing Wounds, Rash and Ulcer. HEENT Present- Visual Disturbances and Wears glasses/contact lenses. Not Present- Earache, Hearing Loss, Hoarseness, Nose Bleed, Oral Ulcers, Ringing in the Ears, Seasonal Allergies, Sinus Pain, Sore Throat and Yellow Eyes. Respiratory Not Present- Bloody sputum, Chronic Cough, Difficulty Breathing, Snoring and Wheezing. Breast Not Present- Breast Mass, Breast Pain, Nipple Discharge and Skin Changes. Cardiovascular Not Present- Chest Pain, Difficulty Breathing Lying Down, Leg Cramps, Palpitations, Rapid Heart Rate, Shortness of Breath and Swelling of Extremities. Gastrointestinal Present- Abdominal Pain. Not Present- Bloating, Bloody Stool, Change in Bowel Habits, Chronic diarrhea, Constipation, Difficulty Swallowing, Excessive gas, Gets full quickly at meals, Hemorrhoids, Indigestion, Nausea, Rectal Pain and Vomiting. Male Genitourinary Not Present- Blood in Urine, Change in Urinary Stream, Frequency, Impotence, Nocturia, Painful Urination, Urgency and Urine Leakage. Musculoskeletal Present- Joint Pain. Not Present- Back Pain, Joint Stiffness, Muscle Pain, Muscle Weakness and Swelling of Extremities. Neurological Not Present- Decreased Memory, Fainting, Headaches, Numbness, Seizures, Tingling, Tremor, Trouble walking and Weakness. Psychiatric Not Present- Anxiety, Bipolar, Change in Sleep Pattern, Depression, Fearful and Frequent crying. Endocrine Not Present- Cold Intolerance, Excessive Hunger, Hair  Changes, Heat Intolerance, Hot flashes and New Diabetes. Hematology Not Present- Easy Bruising, Excessive bleeding, Gland problems, HIV and Persistent Infections.  Vitals Elbert Ewings CMA; 06/19/2015 4:02 PM) 06/19/2015 4:01 PM Weight: 179.8 lb Height: 72in Body Surface Area: 2.04 m Body Mass  Index: 24.39 kg/m Temp.: 98.7F(Temporal)  Pulse: 70 (Regular)  BP: 132/72 (Sitting, Left Arm, Standard)     Physical Exam Adin Hector MD; 06/19/2015 4:31 PM)  General Mental Status-Alert. General Appearance-Not in acute distress, Not Sickly. Orientation-Oriented X3. Hydration-Well hydrated. Voice-Normal.  Integumentary Global Assessment Upon inspection and palpation of skin surfaces of the - Axillae: non-tender, no inflammation or ulceration, no drainage. and Distribution of scalp and body hair is normal. General Characteristics Temperature - normal warmth is noted.  Head and Neck Head-normocephalic, atraumatic with no lesions or palpable masses. Face Global Assessment - atraumatic, no absence of expression. Neck Global Assessment - no abnormal movements, no bruit auscultated on the right, no bruit auscultated on the left, no decreased range of motion, non-tender. Trachea-midline. Thyroid Gland Characteristics - non-tender.  Eye Eyeball - Left-Extraocular movements intact, No Nystagmus. Eyeball - Right-Extraocular movements intact, No Nystagmus. Cornea - Left-No Hazy. Cornea - Right-No Hazy. Sclera/Conjunctiva - Left-No scleral icterus, No Discharge. Sclera/Conjunctiva - Right-No scleral icterus, No Discharge. Pupil - Left-Direct reaction to light normal. Pupil - Right-Direct reaction to light normal. 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 Right - no drainage observed, no generalized tenderness observed. Nose  and Sinuses External Inspection of the Nose - no destructive lesion observed. Inspection of the nares - Left - quiet respiration. Right - quiet respiration. Mouth and Throat Lips - Upper Lip - no fissures observed, no pallor noted. Lower Lip - no fissures observed, no pallor noted. Nasopharynx - no discharge present. Oral Cavity/Oropharynx - Tongue - no dryness observed. Oral Mucosa - no cyanosis observed. Hypopharynx - no evidence of airway distress observed.  Chest and Lung Exam Inspection Movements - Normal and Symmetrical. Accessory muscles - No use of accessory muscles in breathing. Palpation Palpation of the chest reveals - Non-tender. Auscultation Breath sounds - Normal and Clear.  Cardiovascular Auscultation Rhythm - Regular. Murmurs & Other Heart Sounds - Auscultation of the heart reveals - No Murmurs and No Systolic Clicks.  Abdomen Inspection Inspection of the abdomen reveals - No Visible peristalsis and No Abnormal pulsations. Umbilicus - No Bleeding, No Urine drainage. Palpation/Percussion Palpation and Percussion of the abdomen reveal - Soft, Non Tender, No Rebound tenderness, No Rigidity (guarding) and No Cutaneous hyperesthesia. Note: Soft and flat. No diastases. Small 10 x 5 mm umbilical hernia   Male Genitourinary Sexual Maturity Tanner 5 - Adult hair pattern and Adult penile size and shape. Note: Circumcised male. Testes epididymides cords normal. Obvious RIGHT groin bulge reducible consistent with inguinal hernia. Near-normal subtle impulse on LEFT side with minimal sensitivity.   Peripheral Vascular Upper Extremity Inspection - Left - No Cyanotic nailbeds, Not Ischemic. Right - No Cyanotic nailbeds, Not Ischemic.  Neurologic Neurologic evaluation reveals -normal attention span and ability to concentrate, able to name objects and repeat phrases. Appropriate fund of knowledge , normal sensation and normal coordination. Mental Status Affect - not angry, not  paranoid. Cranial Nerves-Normal Bilaterally. Gait-Normal.  Neuropsychiatric Mental status exam performed with findings of-able to articulate well with normal speech/language, rate, volume and coherence, thought content normal with ability to perform basic computations and apply abstract reasoning and no evidence of hallucinations, delusions, obsessions or homicidal/suicidal ideation.  Musculoskeletal Global Assessment Spine, Ribs and Pelvis - no instability, subluxation or laxity. Right Upper Extremity - no instability, subluxation or laxity.  Lymphatic Head & Neck  General Head & Neck  Lymphatics: Bilateral - Description - No Localized lymphadenopathy. Axillary  General Axillary Region: Bilateral - Description - No Localized lymphadenopathy. Femoral & Inguinal  Generalized Femoral & Inguinal Lymphatics: Left - Description - No Localized lymphadenopathy. Right - Description - No Localized lymphadenopathy.    Assessment & Plan Adin Hector MD; 06/19/2015 4:33 PM)  RIGHT INGUINAL HERNIA (K40.90) Impression: Definite RIGHT inguinal hernia. Perhaps mild LEFT inguinal hernias well. Moderately active male with moderate to heavy duty. Think he would benefit from surgical repair. He agrees. He is interesting having the other side check to be sure.  Reasonable laparoscopic approach.  Current Plans You are being scheduled for surgery - Our schedulers will call you.  You should hear from our office's scheduling department within 5 working days about the location, date, and time of surgery. We try to make accommodations for patient's preferences in scheduling surgery, but sometimes the OR schedule or the surgeon's schedule prevents [place] from making those accommodations.  If you have not heard from our office [phone]) in 5 working days, call the office and ask for your surgeon's nurse.  If you have other questions about your diagnosis, plan, or surgery, call the office and ask  for your surgeon's nurse. Pt Education - Pamphlet Given - Laparoscopic Hernia Repair: discussed with patient and provided information.   The anatomy & physiology of the abdominal wall and pelvic floor was discussed. The pathophysiology of hernias in the inguinal and pelvic region was discussed. Natural history risks such as progressive enlargement, pain, incarceration, and strangulation was discussed. Contributors to complications such as smoking, obesity, diabetes, prior surgery, etc were discussed.  I feel the risks of no intervention will lead to serious problems that outweigh the operative risks; therefore, I recommended surgery to reduce and repair the hernia. I explained laparoscopic techniques with possible need for an open approach. I noted usual use of mesh to patch and/or buttress hernia repair  Risks such as bleeding, infection, abscess, need for further treatment, heart attack, death, and other risks were discussed. I noted a good likelihood this will help address the problem. Goals of post-operative recovery were discussed as well. Possibility that this will not correct all symptoms was explained. I stressed the importance of low-impact activity, aggressive pain control, avoiding constipation, & not pushing through pain to minimize risk of post-operative chronic pain or injury. Possibility of reherniation was discussed. We will work to minimize complications.  An educational handout further explaining the pathology & treatment options was given as well. Questions were answered. The patient expresses understanding & wishes to proceed with surgery. Pt Education - [facility] Hernia Post-Op HCI (Melik Blancett): discussed with patient and provided information. Pt Education - [facility] Pain Control (Josiah Wojtaszek) UMBILICAL HERNIA WITHOUT OBSTRUCTION AND WITHOUT GANGRENE (K42.9) Impression: Think it is small enough that it can be closed primarily. He agrees with getting it fixed while I am there.  Current Plans   The  anatomy & physiology of the abdominal wall was discussed. The pathophysiology of hernias was discussed. Natural history risks without surgery including progeressive enlargement, pain, incarceration, & strangulation was discussed. Contributors to complications such as smoking, obesity, diabetes, prior surgery, etc were discussed.  I feel the risks of no intervention will lead to serious problems that outweigh the operative risks; therefore, I recommended surgery to reduce and repair the hernia. I explained laparoscopic techniques with possible need for an open approach. I noted the probable use of mesh to patch and/or buttress the hernia repair  Risks such as  bleeding, infection, abscess, need for further treatment, heart attack, death, and other risks were discussed. I noted a good likelihood this will help address the problem. Goals of post-operative recovery were discussed as well. Possibility that this will not correct all symptoms was explained. I stressed the importance of low-impact activity, aggressive pain control, avoiding constipation, & not pushing through pain to minimize risk of post-operative chronic pain or injury. Possibility of reherniation especially with smoking, obesity, diabetes, immunosuppression, and other health conditions was discussed. We will work to minimize complications.  An educational handout further explaining the pathology & treatment options was given as well. Questions were answered. The patient expresses understanding & wishes to proceed with surgery.  STOP SMOKING! We talked to the patient about the dangers of smoking.  We stressed that tobacco use dramatically increases the risk of peri-operative complications such as infection, tissue necrosis leaving to problems with incision/wound and organ healing, hernia, chronic pain, heart attack, stroke, DVT, pulmonary embolism, and death.  We noted there are programs in our community to help stop smoking.  Information was  available.  Adin Hector, M.D., F.A.C.S. Gastrointestinal and Minimally Invasive Surgery [facility] [address], [place], [place] [address] [phone] Main / Paging

## 2015-06-29 NOTE — Patient Instructions (Addendum)
YOUR PROCEDURE IS SCHEDULED ON : 07/06/15  REPORT TO [facility] FOLLOW SIGNS TO EAST ELEVATOR - GO TO 3rd FLOOR CHECK IN AT 3 EAST NURSES STATION (SHORT STAY) AT: 12:15 PM  CALL THIS NUMBER IF YOU HAVE PROBLEMS THE MORNING OF SURGERY [phone]  REMEMBER:ONLY 1 PER PERSON MAY GO TO SHORT STAY WITH YOU TO GET READY THE MORNING OF YOUR SURGERY  DO NOT EAT FOOD  AFTER MIDNIGHT   MAY HAVE CLEAR LIQUIDS UNTIL 8:15 AM  TAKE THESE MEDICINES THE MORNING OF SURGERY: METOPROLOL  STOP ASPIRIN / IBUPROFEN / ALEVE / VITAMINS / HERBAL MEDS __5__ DAYS BEFORE SURGERY  CLEAR LIQUID DIET   Foods Allowed                                                                     Foods Excluded  Coffee and tea, regular and decaf                             liquids that you cannot  Plain Jell-O in any flavor                                             see through such as: Fruit ices (not with fruit pulp)                                     milk, soups, orange juice  Iced Popsicles                                                  All solid food Carbonated beverages, regular and diet                                    Cranberry, grape and apple juices Sports drinks like Gatorade Lightly seasoned clear broth or consume(fat free) Sugar, honey syrup   _____________________________________________________________________    YOU MAY NOT HAVE ANY METAL ON YOUR BODY INCLUDING HAIR PINS AND PIERCING'S. DO NOT WEAR JEWELRY, MAKEUP, LOTIONS, POWDERS OR PERFUMES. DO NOT WEAR NAIL POLISH. DO NOT SHAVE 48 HRS PRIOR TO SURGERY. MEN MAY SHAVE FACE AND NECK.  DO NOT [place]. [facility] IS NOT RESPONSIBLE FOR VALUABLES.  CONTACTS, DENTURES OR PARTIALS MAY NOT BE WORN TO SURGERY. LEAVE SUITCASE IN CAR. CAN BE BROUGHT TO ROOM AFTER SURGERY.  PATIENTS DISCHARGED THE DAY OF SURGERY WILL NOT BE ALLOWED TO DRIVE HOME.  PLEASE READ OVER THE FOLLOWING INSTRUCTION  SHEETS _________________________________________________________________________________                                          [facility] - PREPARING FOR  SURGERY  Before surgery, you can play an important role.  Because skin is not sterile, your skin needs to be as free of germs as possible.  You can reduce the number of germs on your skin by washing with CHG (chlorahexidine gluconate) soap before surgery.  CHG is an antiseptic cleaner which kills germs and bonds with the skin to continue killing germs even after washing. Please DO NOT use if you have an allergy to CHG or antibacterial soaps.  If your skin becomes reddened/irritated stop using the CHG and inform your nurse when you arrive at Short Stay. Do not shave (including legs and underarms) for at least 48 hours prior to the first CHG shower.  You may shave your face. Please follow these instructions carefully:   1.  Shower with CHG Soap the night before surgery and the  morning of Surgery.   2.  If you choose to wash your hair, wash your hair first as usual with your  normal  Shampoo.   3.  After you shampoo, rinse your hair and body thoroughly to remove the  shampoo.                                         4.  Use CHG as you would any other liquid soap.  You can apply chg directly  to the skin and wash . Gently wash with scrungie or clean wascloth    5.  Apply the CHG Soap to your body ONLY FROM THE NECK DOWN.   Do not use on open                           Wound or open sores. Avoid contact with eyes, ears mouth and genitals (private parts).                        Genitals (private parts) with your normal soap.              6.  Wash thoroughly, paying special attention to the area where your surgery  will be performed.   7.  Thoroughly rinse your body with warm water from the neck down.   8.  DO NOT shower/wash with your normal soap after using and rinsing off  the CHG Soap .                9.  Pat yourself dry with a clean  towel.             10.  Wear clean night clothes to bed after shower             11.  Place clean sheets on your bed the night of your first shower and do not  sleep with pets.  Day of Surgery : Do not apply any lotions/deodorants the morning of surgery.  Please wear clean clothes to the hospital/surgery center.  FAILURE TO FOLLOW THESE INSTRUCTIONS MAY RESULT IN THE CANCELLATION OF YOUR SURGERY    PATIENT SIGNATURE_________________________________  ______________________________________________________________________

## 2015-07-03 ENCOUNTER — Encounter (HOSPITAL_COMMUNITY)
Admission: RE | Admit: 2015-07-03 | Discharge: 2015-07-03 | Disposition: A | Discharge status: Home / Self Care | Payer: BLUE CROSS/BLUE SHIELD | Source: Ambulatory Visit | Attending: Surgery | Admitting: Surgery

## 2015-07-03 ENCOUNTER — Encounter (HOSPITAL_COMMUNITY): Payer: Self-pay

## 2015-07-03 DIAGNOSIS — K429 Umbilical hernia without obstruction or gangrene: Secondary | ICD-10-CM | POA: Insufficient documentation

## 2015-07-03 DIAGNOSIS — K4020 Bilateral inguinal hernia, without obstruction or gangrene, not specified as recurrent: Secondary | ICD-10-CM | POA: Diagnosis not present

## 2015-07-03 DIAGNOSIS — Z01818 Encounter for other preprocedural examination: Secondary | ICD-10-CM | POA: Diagnosis present

## 2015-07-03 HISTORY — DX: Personal history of other malignant neoplasm of skin: Z85.828

## 2015-07-03 HISTORY — DX: Unspecified osteoarthritis, unspecified site: M19.90

## 2015-07-03 HISTORY — DX: Cardiac arrhythmia, unspecified: I49.9

## 2015-07-03 HISTORY — DX: Unilateral inguinal hernia, without obstruction or gangrene, not specified as recurrent: K40.90

## 2015-07-03 LAB — BASIC METABOLIC PANEL
ANION GAP: 9 (ref 5–15)
BUN: 15 mg/dL (ref 6–20)
CHLORIDE: 104 mmol/L (ref 101–111)
CO2: 26 mmol/L (ref 22–32)
CREATININE: 0.94 mg/dL (ref 0.61–1.24)
Calcium: 9 mg/dL (ref 8.9–10.3)
GFR calc non Af Amer: >60 mL/min (ref >60)
Glucose, Bld: 112 mg/dL — ABNORMAL HIGH (ref 65–99)
Potassium: 4 mmol/L (ref 3.5–5.1)
SODIUM: 139 mmol/L (ref 135–145)

## 2015-07-03 LAB — CBC
HCT: 47.2 % (ref 39.0–52.0)
HEMOGLOBIN: 16.6 g/dL (ref 13.0–17.0)
MCH: 35 pg — ABNORMAL HIGH (ref 26.0–34.0)
MCHC: 35.2 g/dL (ref 30.0–36.0)
MCV: 99.6 fL (ref 78.0–100.0)
Platelets: 159 10*3/uL (ref 150–400)
RBC: 4.74 MIL/uL (ref 4.22–5.81)
RDW: 13 % (ref 11.5–15.5)
WBC: 9.1 10*3/uL (ref 4.0–10.5)

## 2015-07-06 ENCOUNTER — Encounter: Payer: Self-pay | Admitting: Family Medicine

## 2015-07-06 ENCOUNTER — Ambulatory Visit (HOSPITAL_COMMUNITY): Admission: RE | Admit: 2015-07-06 | Payer: BLUE CROSS/BLUE SHIELD | Source: Ambulatory Visit | Admitting: Surgery

## 2015-07-06 ENCOUNTER — Encounter (HOSPITAL_COMMUNITY): Admission: RE | Payer: Self-pay | Source: Ambulatory Visit

## 2015-07-06 ENCOUNTER — Ambulatory Visit (INDEPENDENT_AMBULATORY_CARE_PROVIDER_SITE_OTHER): Payer: BLUE CROSS/BLUE SHIELD

## 2015-07-06 ENCOUNTER — Telehealth: Payer: Self-pay | Admitting: Family Medicine

## 2015-07-06 ENCOUNTER — Ambulatory Visit (INDEPENDENT_AMBULATORY_CARE_PROVIDER_SITE_OTHER): Payer: BLUE CROSS/BLUE SHIELD | Admitting: Family Medicine

## 2015-07-06 VITALS — BP 138/88 | HR 73 | Temp 98.4°F | Wt 177.0 lb

## 2015-07-06 DIAGNOSIS — R05 Cough: Secondary | ICD-10-CM

## 2015-07-06 DIAGNOSIS — J9811 Atelectasis: Secondary | ICD-10-CM | POA: Diagnosis not present

## 2015-07-06 DIAGNOSIS — J189 Pneumonia, unspecified organism: Secondary | ICD-10-CM | POA: Insufficient documentation

## 2015-07-06 DIAGNOSIS — R059 Cough, unspecified: Secondary | ICD-10-CM

## 2015-07-06 SURGERY — LAPAROSCOPIC INGUINAL HERNIA WITH UMBILICAL HERNIA
Anesthesia: General | Laterality: Bilateral

## 2015-07-06 MED ORDER — IPRATROPIUM-ALBUTEROL 0.5-2.5 (3) MG/3ML IN SOLN
3.0000 mL | Freq: Once | RESPIRATORY_TRACT | Status: AC
  Administered 2015-07-06: 3 mL via RESPIRATORY_TRACT

## 2015-07-06 MED ORDER — PROMETHAZINE-CODEINE 6.25-10 MG/5ML PO SYRP: 7.5000 mL | ORAL_SOLUTION | Freq: Four times a day (QID) | ORAL | Status: DC | PRN

## 2015-07-06 MED ORDER — LEVOFLOXACIN 500 MG PO TABS
500.0000 mg | ORAL_TABLET | Freq: Every day | ORAL | Status: DC
Start: 2015-07-06 — End: 2015-07-12

## 2015-07-06 NOTE — Assessment & Plan Note (Signed)
Cough likely due to bronchitis or possible mild COPD exacerbation. Patient had mild improvement with DuoNeb. Stat chest x-ray pending. Will call patient before his treatment schedule. Will ultimately deferred to anesthesiology regarding the safety of general anesthesia today.

## 2015-07-06 NOTE — Progress Notes (Signed)
Jamie York is a 64 y.o. male who presents to [facility]: Primary Care  today for cough. Patient is a 5 day history of coughing congestion. The cough is productive occasionally of blood-tinged sputum. He denies any significant wheezing or shortness of breath. He does feel fatigued. No chest pain palpitations. He does note that he is due to have surgery today for a hernia.   Past Medical History  Diagnosis Date  . Skin cancer 2013  . 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 Dysrhythmia     HX OF FREQUENT PVC'S  . Arthritis   . History of skin cancer   . Inguinal hernia    Past Surgical History  Procedure Laterality Date  . Tonsillectomy    . Rotator cuff repair  2014  . Knee cartilage surgery  1978  . Eye surgery  left eye   Social History  Substance Use Topics  . Smoking status: Current Every Day Smoker  . Smokeless tobacco: Not on file  . Alcohol Use: 4.2 oz/week    7 Cans of beer per week   family history includes Cancer - Colon in his mother.  ROS as above Medications: Current Outpatient Prescriptions  Medication Sig Dispense Refill  . HYDROcodone-acetaminophen (NORCO) 10-325 MG per tablet Take 1 tablet by mouth every 6 (six) hours as needed for moderate pain.     . metoprolol succinate (TOPROL-XL) 25 MG 24 hr tablet Take 0.5 tablets (12.5 mg total) by mouth daily. 10 tablet 0  . naproxen (NAPROSYN) 250 MG tablet Take 250 mg by mouth 2 (two) times daily as needed for moderate pain.     Marland Kitchen PARoxetine (PAXIL-CR) 25 MG 24 hr tablet Take 25 mg by mouth daily.     No current facility-administered medications for this visit.   No Known Allergies   Exam:  BP 138/88 mmHg  Pulse 73  Temp(Src) 98.4 F (36.9 C) (Oral)  Wt 177 lb (80.287 kg)  SpO2 97% Gen: Well NAD HEENT: ,  MMM  Lungs: Normal work of breathing. Coarse breath sounds left lungs. Heart: RRR no MRG Abd: NABS, Soft. Nondistended, Nontender Exts: Brisk capillary refill, warm and well  perfused.   Patient was given a 2.5/0.5 mg DuoNeb nebulizer treatment, and felt a little better  No results found for this or any previous visit (from the past 24 hour(s)). No results found.   Please see individual assessment and plan sections.

## 2015-07-06 NOTE — Telephone Encounter (Signed)
CXR results show pneumonitis and atelectasis. I discussed the case with  Anesthesiology who recommends against surgery today. I called in Levaquin and will  prescribecodine cough  Syrup. Reschdule surgery when feeling better.

## 2015-07-09 ENCOUNTER — Ambulatory Visit: Payer: BLUE CROSS/BLUE SHIELD | Admitting: Family Medicine

## 2015-07-12 ENCOUNTER — Ambulatory Visit (INDEPENDENT_AMBULATORY_CARE_PROVIDER_SITE_OTHER): Payer: BLUE CROSS/BLUE SHIELD | Admitting: Family Medicine

## 2015-07-12 ENCOUNTER — Encounter: Payer: Self-pay | Admitting: Family Medicine

## 2015-07-12 VITALS — BP 134/73 | HR 96 | Temp 98.3°F | Wt 176.0 lb

## 2015-07-12 DIAGNOSIS — I493 Ventricular premature depolarization: Secondary | ICD-10-CM

## 2015-07-12 DIAGNOSIS — J189 Pneumonia, unspecified organism: Secondary | ICD-10-CM

## 2015-07-12 MED ORDER — METOPROLOL SUCCINATE ER 25 MG PO TB24: 12.5000 mg | ORAL_TABLET | Freq: Every day | ORAL | Status: DC

## 2015-07-12 MED ORDER — PROMETHAZINE-CODEINE 6.25-10 MG/5ML PO SYRP: 7.5000 mL | ORAL_SOLUTION | Freq: Four times a day (QID) | ORAL | Status: DC | PRN

## 2015-07-12 NOTE — Assessment & Plan Note (Signed)
Prescribed 5 more tablets of metoprolol.

## 2015-07-12 NOTE — Progress Notes (Signed)
Jamie York is a 64 y.o. male who presents to [facility]: Primary Care  today for follow-up pneumonia. Patient was seen a week ago and diagnosed with pneumonia just prior to having elective hernia surgery. The surgery was obviously delayed. He was prescribed Levaquin. He's feeling improved but not 100% better. He notes continued cough. He is nearly run out of his cough syrup. 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 He was supposed to take it 5 days for 4 and 5 days after surgery. He has 5 tablets (surgery was delayed.   Past Medical History  Diagnosis Date  . Skin cancer 2013  . 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 Dysrhythmia     HX OF FREQUENT PVC'S  . Arthritis   . History of skin cancer   . Inguinal hernia    Past Surgical History  Procedure Laterality Date  . Tonsillectomy    . Rotator cuff repair  2014  . Knee cartilage surgery  1978  . Eye surgery  left eye   Social History  Substance Use Topics  . Smoking status: Current Every Day Smoker  . Smokeless tobacco: Not on file  . Alcohol Use: 4.2 oz/week    7 Cans of beer per week   family history includes Cancer - Colon in his mother.  ROS as above Medications: Current Outpatient Prescriptions  Medication Sig Dispense Refill  . HYDROcodone-acetaminophen (NORCO) 10-325 MG per tablet Take 1 tablet by mouth every 6 (six) hours as needed for moderate pain.     . naproxen (NAPROSYN) 250 MG tablet Take 250 mg by mouth 2 (two) times daily as needed for moderate pain.     . promethazine-codeine (PHENERGAN WITH CODEINE) 6.25-10 MG/5ML syrup Take 7.5 mLs by mouth every 6 (six) hours as needed for cough. 120 mL 0  . metoprolol succinate (TOPROL-XL) 25 MG 24 hr tablet Take 0.5 tablets (12.5 mg total) by mouth daily. 5 tablet 0  . PARoxetine (PAXIL-CR) 25 MG 24 hr tablet Take 25 mg by mouth daily.     No current facility-administered medications for this visit.   No  Known Allergies   Exam:  BP 134/73 mmHg  Pulse 96  Temp(Src) 98.3 F (36.8 C) (Oral)  Wt 176 lb (79.833 kg)  SpO2 98% Gen: Well NAD nontoxic appearing HEENT: EOMI,  MMM or nasal discharge. Lungs: Normal work of breathing. CTABL Heart: RRR no MRG Abd: NABS, Soft. Nondistended, Nontender Exts: Brisk capillary refill, warm and well perfused.   No results found for this or any previous visit (from the past 24 hour(s)). No results found.   Please see individual assessment and plan sections.

## 2015-07-12 NOTE — Assessment & Plan Note (Signed)
Improving. Continue watchful waiting. Follow up with general surgery

## 2015-07-12 NOTE — Patient Instructions (Signed)
Thank you for coming in today. Contact the general surgeon office to discuss when to have surgery.  Call or go to the emergency room if you get worse, have trouble breathing, have chest pains, or palpitations.   Community-Acquired Pneumonia, Adult Pneumonia is an infection of the lungs. There are different types of pneumonia. One type can develop while a person is in a hospital. A different type, called community-acquired pneumonia, develops in people who are not, or have not recently been, in the hospital or other health care facility.  CAUSES Pneumonia may be caused by bacteria, viruses, or funguses. Community-acquired pneumonia is often caused by Streptococcus pneumonia bacteria. These bacteria are often passed from one person to another by breathing in droplets from the cough or sneeze of an infected person. RISK FACTORS The condition is more likely to develop in:  People who havechronic diseases, such as chronic obstructive pulmonary disease (COPD), asthma, congestive heart failure, cystic fibrosis, diabetes, or kidney disease.  People who haveearly-stage or late-stage HIV.  People who havesickle cell disease.  People who havehad their spleen removed (splenectomy).  People who havepoor Human resources officer.  People who havemedical conditions that increase the risk of breathing in (aspirating) secretions their own mouth and nose.   People who havea weakened immune system (immunocompromised).  People who smoke.  People whotravel to areas where pneumonia-causing germs commonly exist.  People whoare around animal habitats or animals that have pneumonia-causing germs, including birds, bats, rabbits, cats, and farm animals. SYMPTOMS Symptoms of this condition include:  Adry cough.  A wet (productive) cough.  Fever.  Sweating.  Chest pain, especially when breathing deeply or coughing.  Rapid breathing or difficulty breathing.  Shortness of breath.  Shaking  chills.  Fatigue.  Muscle aches. DIAGNOSIS Your health care provider will take a medical history and perform a physical exam. You may also have other tests, including:  Imaging studies of your chest, including X-rays.  Tests to check your blood oxygen level and other blood gases.  Other tests on blood, mucus (sputum), fluid around your lungs (pleural fluid), and urine. If your pneumonia is severe, other tests may be done to identify the specific cause of your illness. TREATMENT The type of treatment that you receive depends on many factors, such as the cause of your pneumonia, the medicines you take, and other medical conditions that you have. For most adults, treatment and recovery from pneumonia may occur at home. In some cases, treatment must happen in a hospital. Treatment may include:  Antibiotic medicines, if the pneumonia was caused by bacteria.  Antiviral medicines, if the pneumonia was caused by a virus.  Medicines that are given by mouth or through an IV tube.  Oxygen.  Respiratory therapy. Although rare, treating severe pneumonia may include:  Mechanical ventilation. This is done if you are not breathing well on your own and you cannot maintain a safe blood oxygen level.  Thoracentesis. This procedureremoves fluid around one lung or both lungs to help you breathe better. HOME CARE INSTRUCTIONS  Take over-the-counter and prescription medicines only as told by your health care provider.  Only takecough medicine if you are losing sleep. Understand that cough medicine can prevent your body's natural ability to remove mucus from your lungs.  If you were prescribed an antibiotic medicine, take it as told by your health care provider. Do not stop taking the antibiotic even if you start to feel better.  Sleep in a semi-upright position at night. Try sleeping in a reclining  chair, or place a few pillows under your head.  Do not use tobacco products, including cigarettes,  chewing tobacco, and e-cigarettes. If you need help quitting, ask your health care provider.  Drink enough water to keep your urine clear or pale yellow. This will help to thin out mucus secretions in your lungs. PREVENTION There are ways that you can decrease your risk of developing community-acquired pneumonia. Consider getting a pneumococcal vaccine if:  You are older than 66 years of age.  You are older than 66 years of age and are undergoing cancer treatment, have chronic lung disease, or have other medical conditions that affect your immune system. Ask your health care provider if this applies to you. There are different types and schedules of pneumococcal vaccines. Ask your health care provider which vaccination option is best for you. You may also prevent community-acquired pneumonia if you take these actions:  Get an influenza vaccine every year. Ask your health care provider which type of influenza vaccine is best for you.  Go to the dentist on a regular basis.  Wash your hands often. Use hand sanitizer if soap and water are not available. SEEK MEDICAL CARE IF:  You have a fever.  You are losing sleep because you cannot control your cough with cough medicine. SEEK IMMEDIATE MEDICAL CARE IF:  You have worsening shortness of breath.  You have increased chest pain.  Your sickness becomes worse, especially if you are an older adult or have a weakened immune system.  You cough up blood.   This information is not intended to replace advice given to you by your health care provider. Make sure you discuss any questions you have with your health care provider.   Document Released: 09/01/2005 Document Revised: 05/23/2015 Document Reviewed: 12/27/2014 Elsevier Interactive Patient Education [employer].

## 2015-07-15 ENCOUNTER — Other Ambulatory Visit: Payer: Self-pay | Admitting: Internal Medicine

## 2015-07-17 ENCOUNTER — Encounter (HOSPITAL_COMMUNITY): Payer: Self-pay

## 2015-07-17 ENCOUNTER — Encounter (HOSPITAL_COMMUNITY)
Admission: RE | Admit: 2015-07-17 | Discharge: 2015-07-17 | Disposition: A | Discharge status: Home / Self Care | Payer: BLUE CROSS/BLUE SHIELD | Source: Ambulatory Visit | Attending: Surgery | Admitting: Surgery

## 2015-07-17 ENCOUNTER — Ambulatory Visit (HOSPITAL_COMMUNITY)
Admission: RE | Admit: 2015-07-17 | Discharge: 2015-07-17 | Disposition: A | Discharge status: Home / Self Care | Payer: BLUE CROSS/BLUE SHIELD | Source: Ambulatory Visit | Attending: Anesthesiology | Admitting: Anesthesiology

## 2015-07-17 DIAGNOSIS — Z01818 Encounter for other preprocedural examination: Secondary | ICD-10-CM

## 2015-07-17 DIAGNOSIS — I517 Cardiomegaly: Secondary | ICD-10-CM | POA: Diagnosis not present

## 2015-07-17 DIAGNOSIS — K4090 Unilateral inguinal hernia, without obstruction or gangrene, not specified as recurrent: Secondary | ICD-10-CM | POA: Diagnosis not present

## 2015-07-17 DIAGNOSIS — K429 Umbilical hernia without obstruction or gangrene: Secondary | ICD-10-CM | POA: Insufficient documentation

## 2015-07-17 HISTORY — DX: Blindness, one eye, unspecified eye: H54.40

## 2015-07-17 HISTORY — DX: Pneumonia, unspecified organism: J18.9

## 2015-07-17 NOTE — Pre-Procedure Instructions (Addendum)
07-17-15 CXR done today- (last 07-06-15 showing pneumonia). Recent placed on Metoprolol- heart rate 40 today- instructed patient to inform cardiology MD- use his advisement for taking med and verify daily dose to be taken, patient  voiced understanding. Labs done 07-03-15 CBC, BMP being used -Epic. EKG 06-12-15 Epic.

## 2015-07-17 NOTE — Patient Instructions (Addendum)
20 Jamie York  07/17/2015   Your procedure is scheduled on:   07-19-2015 Thursday  Enter through [place] and follow signs to [facility] Arrive at       1100 AM .  (Limit 1 person with you).  Call this number if you have problems the morning of surgery: [phone]  Or Presurgical Testing [phone].   For Living Will and/or Health Care Power Attorney Forms: please provide copy for your medical record,may bring AM of surgery(Forms should be already notarized -we do not provide this service).(11--01-16  No information preferred today).  Remember: Follow any bowel prep instructions per MD office.    Do not eat food/ or drink: After Midnight.  Exception: may have clear liquids:up to 6 Hours before arrival. Nothing after: 0700 AM  Clear liquids include soda, tea, black coffee, apple or grape juice, broth.  Take these medicines the morning of surgery with A SIP OF WATER-   (DO NOT TAKE ANY DIABETIC MEDS AM OF SURGERY) : Metoprolol.   Do not wear jewelry, make-up or nail polish.  Do not wear deodorant, lotions, powders, or perfumes.   Do not shave legs and under arms- 48 hours(2 days) prior to first CHG shower.(Shaving face and neck okay.)  Do not bring valuables to the hospital.(Hospital is not responsible for lost valuables).  Contacts, dentures or removable bridgework, body piercing, hair pins may not be worn into surgery.  Leave suitcase in the car. After surgery it may be brought to your room.  For patients admitted to the hospital, checkout time is 11:00 AM the day of discharge.(Restricted visitors-Any Persons displaying flu-like symptoms or illness).    Patients discharged the day of surgery will not be allowed to drive home. Must have responsible person with you x 24 hours once discharged.  Name and phone number of your driver: Edwena Felty Johnson-girlfriend [phone] cell     Please read over the following fact sheets that you were given:   CHG(Chlorhexidine Gluconate 4% Surgical Soap) use.  [facility] - Preparing for Surgery Before surgery, you can play an important role.  Because skin is not sterile, your skin needs to be as free of germs as possible.  You can reduce the number of germs on your skin by washing with CHG (chlorahexidine gluconate) soap before surgery.  CHG is an antiseptic cleaner which kills germs and bonds with the skin to continue killing germs even after washing. Please DO NOT use if you have an allergy to CHG or antibacterial soaps.  If your skin becomes reddened/irritated stop using the CHG and inform your nurse when you arrive at Short Stay. Do not shave (including legs and underarms) for at least 48 hours prior to the first CHG shower.  You may shave your face/neck. Please follow these instructions carefully:  1.  Shower with CHG Soap the night before surgery and the  morning of Surgery.  2.  If you choose to wash your hair, wash your hair first as usual with your  normal  shampoo.  3.  After you shampoo, rinse your hair and body thoroughly to remove the  shampoo.                           4.  Use CHG as you would any other liquid soap.  You can apply chg directly  to the skin and wash  Gently with a scrungie or clean washcloth.  5.  Apply the CHG Soap to your body ONLY FROM THE NECK DOWN.   Do not use on face/ open                           Wound or open sores. Avoid contact with eyes, ears mouth and genitals (private parts).                       Wash face,  Genitals (private parts) with your normal soap.             6.  Wash thoroughly, paying special attention to the area where your surgery  will be performed.  7.  Thoroughly rinse your body with warm water from the neck down.  8.  DO NOT shower/wash with your normal soap after using and rinsing off  the CHG Soap.                9.  Pat yourself dry with a clean towel.            10.  Wear clean pajamas.            11.  Place clean  sheets on your bed the night of your first shower and do not  sleep with pets. Day of Surgery : Do not apply any lotions/deodorants the morning of surgery.  Please wear clean clothes to the hospital/surgery center.  FAILURE TO FOLLOW THESE INSTRUCTIONS MAY RESULT IN THE CANCELLATION OF YOUR SURGERY PATIENT SIGNATURE_________________________________  NURSE SIGNATURE__________________________________  ________________________________________________________________________

## 2015-07-18 ENCOUNTER — Telehealth: Payer: Self-pay

## 2015-07-18 NOTE — Telephone Encounter (Signed)
Prior auth for Metoprolol is not through [employer]. He is no longer eligible for [employer] Comp. Advised [facility] to run it through [employer], [place].

## 2015-07-19 ENCOUNTER — Encounter: Payer: Self-pay | Admitting: Internal Medicine

## 2015-07-19 ENCOUNTER — Ambulatory Visit (HOSPITAL_COMMUNITY): Payer: BLUE CROSS/BLUE SHIELD | Admitting: Certified Registered Nurse Anesthetist

## 2015-07-19 ENCOUNTER — Encounter (HOSPITAL_COMMUNITY): Payer: Self-pay | Admitting: *Deleted

## 2015-07-19 ENCOUNTER — Ambulatory Visit (HOSPITAL_COMMUNITY)
Admission: RE | Admit: 2015-07-19 | Discharge: 2015-07-19 | Disposition: A | Discharge status: Home / Self Care | Payer: BLUE CROSS/BLUE SHIELD | Source: Ambulatory Visit | Attending: Surgery | Admitting: Surgery

## 2015-07-19 ENCOUNTER — Encounter (HOSPITAL_COMMUNITY)
Admission: RE | Disposition: A | Discharge status: Home / Self Care | Payer: Self-pay | Source: Ambulatory Visit | Attending: Surgery

## 2015-07-19 DIAGNOSIS — K4020 Bilateral inguinal hernia, without obstruction or gangrene, not specified as recurrent: Secondary | ICD-10-CM | POA: Diagnosis not present

## 2015-07-19 DIAGNOSIS — Z791 Long term (current) use of non-steroidal anti-inflammatories (NSAID): Secondary | ICD-10-CM | POA: Insufficient documentation

## 2015-07-19 DIAGNOSIS — F17290 Nicotine dependence, other tobacco product, uncomplicated: Secondary | ICD-10-CM | POA: Diagnosis not present

## 2015-07-19 DIAGNOSIS — Z79891 Long term (current) use of opiate analgesic: Secondary | ICD-10-CM | POA: Insufficient documentation

## 2015-07-19 DIAGNOSIS — I493 Ventricular premature depolarization: Secondary | ICD-10-CM | POA: Insufficient documentation

## 2015-07-19 DIAGNOSIS — Z79899 Other long term (current) drug therapy: Secondary | ICD-10-CM | POA: Diagnosis not present

## 2015-07-19 DIAGNOSIS — K429 Umbilical hernia without obstruction or gangrene: Secondary | ICD-10-CM

## 2015-07-19 DIAGNOSIS — K4090 Unilateral inguinal hernia, without obstruction or gangrene, not specified as recurrent: Secondary | ICD-10-CM | POA: Diagnosis present

## 2015-07-19 HISTORY — PX: INGUINAL HERNIA REPAIR: SHX194

## 2015-07-19 HISTORY — PX: UMBILICAL HERNIA REPAIR: SHX196

## 2015-07-19 SURGERY — REPAIR, HERNIA, INGUINAL, BILATERAL, LAPAROSCOPIC
Anesthesia: General | Site: Abdomen

## 2015-07-19 MED ORDER — CHLORHEXIDINE GLUCONATE 4 % EX LIQD: 1.0000 "application " | Freq: Once | CUTANEOUS | Status: DC

## 2015-07-19 MED ORDER — BUPIVACAINE-EPINEPHRINE 0.25% -1:200000 IJ SOLN
INTRAMUSCULAR | Status: DC | PRN
  Administered 2015-07-19: 80 mL

## 2015-07-19 MED ORDER — GLYCOPYRROLATE 0.2 MG/ML IJ SOLN
INTRAMUSCULAR | Status: DC | PRN
  Administered 2015-07-19: 0.6 mg via INTRAVENOUS

## 2015-07-19 MED ORDER — NEOSTIGMINE METHYLSULFATE 10 MG/10ML IV SOLN
INTRAVENOUS | Status: AC
  Filled 2015-07-19: qty 1

## 2015-07-19 MED ORDER — KETOROLAC TROMETHAMINE 30 MG/ML IJ SOLN
INTRAMUSCULAR | Status: AC
  Filled 2015-07-19: qty 1

## 2015-07-19 MED ORDER — PROPOFOL 10 MG/ML IV BOLUS
INTRAVENOUS | Status: AC
  Filled 2015-07-19: qty 20

## 2015-07-19 MED ORDER — HYDROMORPHONE HCL 1 MG/ML IJ SOLN: 0.2500 mg | INTRAMUSCULAR | Status: DC | PRN

## 2015-07-19 MED ORDER — HYDROCODONE-ACETAMINOPHEN 10-325 MG PO TABS: 1.0000 | ORAL_TABLET | Freq: Four times a day (QID) | ORAL | Status: DC | PRN

## 2015-07-19 MED ORDER — NAPROXEN 500 MG PO TABS: 500.0000 mg | ORAL_TABLET | Freq: Two times a day (BID) | ORAL | Status: DC

## 2015-07-19 MED ORDER — BUPIVACAINE-EPINEPHRINE 0.25% -1:200000 IJ SOLN
INTRAMUSCULAR | Status: AC
  Filled 2015-07-19: qty 1

## 2015-07-19 MED ORDER — DEXAMETHASONE SODIUM PHOSPHATE 10 MG/ML IJ SOLN
INTRAMUSCULAR | Status: AC
  Filled 2015-07-19: qty 1

## 2015-07-19 MED ORDER — BUPIVACAINE-EPINEPHRINE (PF) 0.25% -1:200000 IJ SOLN
INTRAMUSCULAR | Status: AC
  Filled 2015-07-19: qty 30

## 2015-07-19 MED ORDER — MIDAZOLAM HCL 5 MG/5ML IJ SOLN
INTRAMUSCULAR | Status: DC | PRN
  Administered 2015-07-19: 2 mg via INTRAVENOUS

## 2015-07-19 MED ORDER — FENTANYL CITRATE (PF) 250 MCG/5ML IJ SOLN
INTRAMUSCULAR | Status: AC
  Filled 2015-07-19: qty 25

## 2015-07-19 MED ORDER — ONDANSETRON HCL 4 MG/2ML IJ SOLN
INTRAMUSCULAR | Status: AC
  Filled 2015-07-19: qty 2

## 2015-07-19 MED ORDER — 0.9 % SODIUM CHLORIDE (POUR BTL) OPTIME
TOPICAL | Status: DC | PRN
  Administered 2015-07-19: 1000 mL

## 2015-07-19 MED ORDER — ROCURONIUM BROMIDE 100 MG/10ML IV SOLN
INTRAVENOUS | Status: DC | PRN
  Administered 2015-07-19: 10 mg via INTRAVENOUS
  Administered 2015-07-19: 45 mg via INTRAVENOUS
  Administered 2015-07-19: 10 mg via INTRAVENOUS
  Administered 2015-07-19: 5 mg via INTRAVENOUS

## 2015-07-19 MED ORDER — LIDOCAINE HCL (CARDIAC) 20 MG/ML IV SOLN
INTRAVENOUS | Status: AC
  Filled 2015-07-19: qty 5

## 2015-07-19 MED ORDER — DEXAMETHASONE SODIUM PHOSPHATE 10 MG/ML IJ SOLN
INTRAMUSCULAR | Status: DC | PRN
  Administered 2015-07-19: 10 mg via INTRAVENOUS

## 2015-07-19 MED ORDER — KETOROLAC TROMETHAMINE 30 MG/ML IJ SOLN
INTRAMUSCULAR | Status: DC | PRN
  Administered 2015-07-19: 30 mg via INTRAVENOUS

## 2015-07-19 MED ORDER — STERILE WATER FOR IRRIGATION IR SOLN
Status: DC | PRN
  Administered 2015-07-19: 1000 mL

## 2015-07-19 MED ORDER — GLYCOPYRROLATE 0.2 MG/ML IJ SOLN
INTRAMUSCULAR | Status: AC
  Filled 2015-07-19: qty 3

## 2015-07-19 MED ORDER — MIDAZOLAM HCL 2 MG/2ML IJ SOLN
INTRAMUSCULAR | Status: AC
  Filled 2015-07-19: qty 4

## 2015-07-19 MED ORDER — LACTATED RINGERS IV SOLN
INTRAVENOUS | Status: DC
  Administered 2015-07-19: 1000 mL via INTRAVENOUS

## 2015-07-19 MED ORDER — ROCURONIUM BROMIDE 100 MG/10ML IV SOLN
INTRAVENOUS | Status: AC
  Filled 2015-07-19: qty 1

## 2015-07-19 MED ORDER — LIDOCAINE HCL (CARDIAC) 20 MG/ML IV SOLN
INTRAVENOUS | Status: DC | PRN
  Administered 2015-07-19: 50 mg via INTRAVENOUS

## 2015-07-19 MED ORDER — LACTATED RINGERS IV SOLN
INTRAVENOUS | Status: DC
  Administered 2015-07-19: 15:00:00 via INTRAVENOUS

## 2015-07-19 MED ORDER — SUCCINYLCHOLINE CHLORIDE 20 MG/ML IJ SOLN
INTRAMUSCULAR | Status: DC | PRN
  Administered 2015-07-19: 100 mg via INTRAVENOUS

## 2015-07-19 MED ORDER — NEOSTIGMINE METHYLSULFATE 10 MG/10ML IV SOLN
INTRAVENOUS | Status: DC | PRN
  Administered 2015-07-19: 4 mg via INTRAVENOUS

## 2015-07-19 MED ORDER — CEFAZOLIN SODIUM-DEXTROSE 2-3 GM-% IV SOLR
INTRAVENOUS | Status: AC
  Filled 2015-07-19: qty 50

## 2015-07-19 MED ORDER — PROPOFOL 10 MG/ML IV BOLUS
INTRAVENOUS | Status: DC | PRN
  Administered 2015-07-19: 200 mg via INTRAVENOUS

## 2015-07-19 MED ORDER — HYDROCODONE-ACETAMINOPHEN 10-325 MG PO TABS
1.0000 | ORAL_TABLET | Freq: Four times a day (QID) | ORAL | Status: DC | PRN
  Administered 2015-07-19: 1 via ORAL
  Filled 2015-07-19 (×2): qty 2

## 2015-07-19 MED ORDER — FENTANYL CITRATE (PF) 100 MCG/2ML IJ SOLN
INTRAMUSCULAR | Status: DC | PRN
  Administered 2015-07-19 (×3): 50 ug via INTRAVENOUS
  Administered 2015-07-19: 100 ug via INTRAVENOUS

## 2015-07-19 MED ORDER — ONDANSETRON HCL 4 MG/2ML IJ SOLN
INTRAMUSCULAR | Status: DC | PRN
  Administered 2015-07-19: 4 mg via INTRAVENOUS

## 2015-07-19 MED ORDER — CEFAZOLIN SODIUM-DEXTROSE 2-3 GM-% IV SOLR
2.0000 g | INTRAVENOUS | Status: AC
  Administered 2015-07-19: 2 g via INTRAVENOUS

## 2015-07-19 SURGICAL SUPPLY — 27 items
CABLE HIGH FREQUENCY MONO STRZ (ELECTRODE) ×2 IMPLANT
CHLORAPREP W/TINT 26ML (MISCELLANEOUS) ×4 IMPLANT
COVER SURGICAL LIGHT HANDLE (MISCELLANEOUS) ×2 IMPLANT
DECANTER SPIKE VIAL GLASS SM (MISCELLANEOUS) ×4 IMPLANT
DEVICE SECURE STRAP 25 ABSORB (INSTRUMENTS) IMPLANT
DRAPE LAPAROSCOPIC ABDOMINAL (DRAPES) ×4 IMPLANT
DRAPE UTILITY XL STRL (DRAPES) ×4 IMPLANT
DRAPE WARM FLUID 44X44 (DRAPE) ×4 IMPLANT
DRSG TEGADERM 2-3/8X2-3/4 SM (GAUZE/BANDAGES/DRESSINGS) ×8 IMPLANT
DRSG TEGADERM 4X4.75 (GAUZE/BANDAGES/DRESSINGS) ×6 IMPLANT
ELECT REM PT RETURN 9FT ADLT (ELECTROSURGICAL) ×4
ELECTRODE REM PT RTRN 9FT ADLT (ELECTROSURGICAL) ×2 IMPLANT
GLOVE ECLIPSE 8.0 STRL XLNG CF (GLOVE) ×4 IMPLANT
GLOVE INDICATOR 8.0 STRL GRN (GLOVE) ×4 IMPLANT
GOWN STRL REUS W/TWL XL LVL3 (GOWN DISPOSABLE) ×8 IMPLANT
KIT BASIN OR (CUSTOM PROCEDURE TRAY) ×4 IMPLANT
MESH ULTRAPRO 6X6 15CM15CM (Mesh General) ×4 IMPLANT
SCISSORS LAP 5X35 DISP (ENDOMECHANICALS) ×4 IMPLANT
SET IRRIG TUBING LAPAROSCOPIC (IRRIGATION / IRRIGATOR) IMPLANT
SLEEVE XCEL OPT CAN 5 100 (ENDOMECHANICALS) ×4 IMPLANT
SUT MNCRL AB 4-0 PS2 18 (SUTURE) ×4 IMPLANT
SUT PDS AB 1 CT1 27 (SUTURE) ×4 IMPLANT
TACKER 5MM HERNIA 3.5CML NAB (ENDOMECHANICALS) IMPLANT
TOWEL OR 17X26 10 PK STRL BLUE (TOWEL DISPOSABLE) ×4 IMPLANT
TRAY LAPAROSCOPIC (CUSTOM PROCEDURE TRAY) ×4 IMPLANT
TROCAR BLADELESS OPT 5 100 (ENDOMECHANICALS) ×4 IMPLANT
TROCAR XCEL BLUNT TIP 100MML (ENDOMECHANICALS) ×4 IMPLANT

## 2015-07-19 NOTE — Discharge Instructions (Signed)
HERNIA REPAIR: POST OP INSTRUCTIONS ° °1. DIET: Follow a light bland diet the first 24 hours after arrival home, such as soup, liquids, crackers, etc.  Be sure to include lots of fluids daily.  Avoid fast food or heavy meals as your are more likely to get nauseated.  Eat a low fat the next few days after surgery. °2. Take your usually prescribed home medications unless otherwise directed. °3. PAIN CONTROL: °a. Pain is best controlled by a usual combination of three different methods TOGETHER: °i. Ice/Heat °ii. Over the counter pain medication °iii. Prescription pain medication °b. Most patients will experience some swelling and bruising around the hernia(s) such as the bellybutton, groins, or old incisions.  Ice packs or heating pads (30-60 minutes up to 6 times a day) will help. Use ice for the first few days to help decrease swelling and bruising, then switch to heat to help relax tight/sore spots and speed recovery.  Some people prefer to use ice alone, heat alone, alternating between ice & heat.  Experiment to what works for you.  Swelling and bruising can take several weeks to resolve.   °c. It is helpful to take an over-the-counter pain medication regularly for the first few weeks.  Choose one of the following that works best for you: °i. Naproxen (Aleve, etc)  Two 220mg tabs twice a day °ii. Ibuprofen (Advil, etc) Three 200mg tabs four times a day (every meal & bedtime) °iii. Acetaminophen (Tylenol, etc) 325-650mg four times a day (every meal & bedtime) °d. A  prescription for pain medication should be given to you upon discharge.  Take your pain medication as prescribed.  °i. If you are having problems/concerns with the prescription medicine (does not control pain, nausea, vomiting, rash, itching, etc), please call us [phone] to see if we need to switch you to a different pain medicine that will work better for you and/or control your side effect better. °ii. If you need a refill on your pain  medication, please contact your pharmacy.  They will contact our office to request authorization. Prescriptions will not be filled after 5 pm or on week-ends. °4. Avoid getting constipated.  Between the surgery and the pain medications, it is common to experience some constipation.  Increasing fluid intake and taking a fiber supplement (such as Metamucil, Citrucel, FiberCon, MiraLax, etc) 1-2 times a day regularly will usually help prevent this problem from occurring.  A mild laxative (prune juice, Milk of Magnesia, MiraLax, etc) should be taken according to package directions if there are no bowel movements after 48 hours.   °5. Wash / shower every day.  You may shower over the dressings as they are waterproof.   °6. Remove your waterproof bandages 5 days after surgery.  You may leave the incision open to air.  You may replace a dressing/Band-Aid to cover the incision for comfort if you wish.  Continue to shower over incision(s) after the dressing is off. ° ° ° °7. ACTIVITIES as tolerated:   °a. You may resume regular (light) daily activities beginning the next day--such as daily self-care, walking, climbing stairs--gradually increasing activities as tolerated.  If you can walk 30 minutes without difficulty, it is safe to try more intense activity such as jogging, treadmill, bicycling, low-impact aerobics, swimming, etc. °b. Save the most intensive and strenuous activity for last such as sit-ups, heavy lifting, contact sports, etc  Refrain from any heavy lifting or straining until you are off narcotics for pain control.   °  c. DO NOT PUSH THROUGH PAIN.  Let pain be your guide: If it hurts to do something, don't do it.  Pain is your body warning you to avoid that activity for another week until the pain goes down. °d. You may drive when you are no longer taking prescription pain medication, you can comfortably wear a seatbelt, and you can safely maneuver your car and apply brakes. °e. You may have sexual intercourse  when it is comfortable.  °8. FOLLOW UP in our office °a. Please call [facility] at [phone] to set up an appointment to see your surgeon in the office for a follow-up appointment approximately 2-3 weeks after your surgery. °b. Make sure that you call for this appointment the day you arrive home to insure a convenient appointment time. °9.  IF YOU HAVE DISABILITY OR FAMILY LEAVE FORMS, BRING THEM TO THE OFFICE FOR PROCESSING.  DO NOT GIVE THEM TO YOUR DOCTOR. ° °WHEN TO CALL US [phone]: °1. Poor pain control °2. Reactions / problems with new medications (rash/itching, nausea, etc)  °3. Fever over 101.5 F (38.5 C) °4. Inability to urinate °5. Nausea and/or vomiting °6. Worsening swelling or bruising °7. Continued bleeding from incision. °8. Increased pain, redness, or drainage from the incision ° ° The clinic staff is available to answer your questions during regular business hours (8:30am-5pm).  Please don’t hesitate to call and ask to speak to one of our nurses for clinical concerns.  ° If you have a medical emergency, go to the nearest emergency room or call 911. ° A surgeon from [facility] is always on call at the hospitals in [place] ° °[facility] °[address], [address], [place], [place]  [address] ? ° [address] [address], [place], [place]   [address] °MAIN: [phone] ? TOLL FREE: [phone] ? FAX: [phone] °[email or web address] ° °Managing Pain ° °Pain after surgery or related to activity is often due to strain/injury to muscle, tendon, nerves and/or incisions.  This pain is usually short-term and will improve in a few months.  ° °Many people find it helpful to do the following things TOGETHER to help speed the process of healing and to get back to regular activity more quickly: ° °1. Avoid heavy physical activity at first °a. No lifting greater than 20 pounds at first, then increase to lifting as tolerated over the next few weeks °b. Do not “push  through” the pain.  Listen to your body and avoid positions and maneuvers than reproduce the pain.  Wait a few days before trying something more intense °c. Walking is okay as tolerated, but go slowly and stop when getting sore.  If you can walk 30 minutes without stopping or pain, you can try more intense activity (running, jogging, aerobics, cycling, swimming, treadmill, sex, sports, weightlifting, etc ) °d. Remember: If it hurts to do it, then don’t do it! ° °2. Take Anti-inflammatory medication °a. Choose ONE of the following over-the-counter medications: °i.            Acetaminophen 500mg tabs (Tylenol) 1-2 pills with every meal and just before bedtime (avoid if you have liver problems) °ii.            Naproxen 220mg tabs (ex. Aleve) 1-2 pills twice a day (avoid if you have kidney, stomach, IBD, or bleeding problems) °iii. Ibuprofen 200mg tabs (ex. Advil, Motrin) 3-4 pills with every meal and just before bedtime (avoid if you have kidney, stomach, IBD, or bleeding   problems) °b. Take with food/snack around the clock for 1-2 weeks °i. This helps the muscle and nerve tissues become less irritable and calm down faster ° °3. Use a Heating pad or Ice/Cold Pack °a. 4-6 times a day °b. May use warm bath/hottub  or showers ° °4. Try Gentle Massage and/or Stretching  °a. at the area of pain many times a day °b. stop if you feel pain - do not overdo it ° °Try these steps together to help you body heal faster and avoid making things get worse.  Doing just one of these things may not be enough.   ° °If you are not getting better after two weeks or are noticing you are getting worse, contact our office for further advice; we may need to re-evaluate you & see what other things we can do to help. ° °GETTING TO GOOD BOWEL HEALTH. °Irregular bowel habits such as constipation and diarrhea can lead to many problems over time.  Having one soft bowel movement a day is the most important way to prevent further problems.  The  anorectal canal is designed to handle stretching and feces to safely manage our ability to get rid of solid waste (feces, poop, stool) out of our body.  BUT, hard constipated stools can act like ripping concrete bricks and diarrhea can be a burning fire to this very sensitive area of our body, causing inflamed hemorrhoids, anal fissures, increasing risk is perirectal abscesses, abdominal pain/bloating, an making irritable bowel worse.     ° °The goal: ONE SOFT BOWEL MOVEMENT A DAY!  To have soft, regular bowel movements:  °• Drink plenty of fluids, consider 4-6 tall glasses of water a day.   °• Take plenty of fiber.  Fiber is the undigested part of plant food that passes into the colon, acting s “natures broom” to encourage bowel motility and movement.  Fiber can absorb and hold large amounts of water. This results in a larger, bulkier stool, which is soft and easier to pass. Work gradually over several weeks up to 6 servings a day of fiber (25g a day even more if needed) in the form of: °o Vegetables -- Root (potatoes, carrots, turnips), leafy green (lettuce, salad greens, celery, spinach), or cooked high residue (cabbage, broccoli, etc) °o Fruit -- Fresh (unpeeled skin & pulp), Dried (prunes, apricots, cherries, etc ),  or stewed ( applesauce)  °o Whole grain breads, pasta, etc (whole wheat)  °o Bran cereals  °• Bulking Agents -- This type of water-retaining fiber generally is easily obtained each day by one of the following:  °o Psyllium bran -- The psyllium plant is remarkable because its ground seeds can retain so much water. This product is available as Metamucil, Konsyl, Effersyllium, Per Diem Fiber, or the less expensive generic preparation in drug and health food stores. Although labeled a laxative, it really is not a laxative.  °o Methylcellulose -- This is another fiber derived from wood which also retains water. It is available as Citrucel. °o Polyethylene Glycol - and “artificial” fiber commonly called  Miralax or Glycolax.  It is helpful for people with gassy or bloated feelings with regular fiber °o Flax Seed - a less gassy fiber than psyllium °• No reading or other relaxing activity while on the toilet. If bowel movements take longer than 5 minutes, you are too constipated °• AVOID CONSTIPATION.  High fiber and water intake usually takes care of this.  Sometimes a laxative is needed to stimulate more frequent   bowel movements, but   Laxatives are not a good long-term solution as it can wear the colon out.  They can help jump-start bowels if constipated, but should be relied on constantly without discussing with your doctor o Osmotics (Milk of Magnesia, Fleets phosphosoda, Magnesium citrate, MiraLax, GoLytely) are safer than  o Stimulants (Senokot, Castor Oil, Dulcolax, Ex Lax)    o Avoid taking laxatives for more than 7 days in a row.   IF SEVERELY CONSTIPATED, try a Bowel Retraining Program: o Do not use laxatives.  o Eat a diet high in roughage, such as bran cereals and leafy vegetables.  o Drink six (6) ounces of prune or apricot juice each morning.  o Eat two (2) large servings of stewed fruit each day.  o Take one (1) heaping tablespoon of a psyllium-based bulking agent twice a day. Use sugar-free sweetener when possible to avoid excessive calories.  o Eat a normal breakfast.  o Set aside 15 minutes after breakfast to sit on the toilet, but do not strain to have a bowel movement.  o If you do not have a bowel movement by the third day, use an enema and repeat the above steps.   Controlling diarrhea o Switch to liquids and simpler foods for a few days to avoid stressing your intestines further. o Avoid dairy products (especially milk & ice cream) for a short time.  The intestines often can lose the ability to digest lactose when stressed. o Avoid foods that cause gassiness or bloating.  Typical foods include beans and other legumes, cabbage, broccoli, and dairy foods.  Every person has  some sensitivity to other foods, so listen to our body and avoid those foods that trigger problems for you. o Adding fiber (Citrucel, Metamucil, psyllium, Miralax) gradually can help thicken stools by absorbing excess fluid and retrain the intestines to act more normally.  Slowly increase the dose over a few weeks.  Too much fiber too soon can backfire and cause cramping & bloating. o Probiotics (such as active yogurt, Align, etc) may help repopulate the intestines and colon with normal bacteria and calm down a sensitive digestive tract.  Most studies show it to be of mild help, though, and such products can be costly. o Medicines: - Bismuth subsalicylate (ex. Kayopectate, Pepto Bismol) every 30 minutes for up to 6 doses can help control diarrhea.  Avoid if pregnant. - Loperamide (Immodium) can slow down diarrhea.  Start with two tablets (4mg  total) first and then try one tablet every 6 hours.  Avoid if you are having fevers or severe pain.  If you are not better or start feeling worse, stop all medicines and call your doctor for advice o Call your doctor if you are getting worse or not better.  Sometimes further testing (cultures, endoscopy, X-ray studies, bloodwork, etc) may be needed to help diagnose and treat the cause of the diarrhea.  TROUBLESHOOTING IRREGULAR BOWELS 1) Avoid extremes of bowel movements (no bad constipation/diarrhea) 2) Miralax 17gm mixed in 8oz. water or juice-daily. May use BID as needed.  3) Gas-x,Phazyme, etc. as needed for gas & bloating.  4) Soft,bland diet. No spicy,greasy,fried foods.  5) Prilosec over-the-counter as needed  6) May hold gluten/wheat products from diet to see if symptoms improve.  7)  May try probiotics (Align, Activa, etc) to help calm the bowels down 7) If symptoms become worse call back immediately.  Inguinal Hernia, Adult Muscles help keep everything in the body in its proper place. But if  a weak spot in the muscles develops, something can poke  through. That is called a hernia. When this happens in the lower part of the belly (abdomen), it is called an inguinal hernia. (It takes its name from a part of the body in this region called the inguinal canal.) A weak spot in the wall of muscles lets some fat or part of the small intestine bulge through. An inguinal hernia can develop at any age. Men get them more often than women. CAUSES  In adults, an inguinal hernia develops over time.  It can be triggered by:  Suddenly straining the muscles of the lower abdomen.  Lifting heavy objects.  Straining to have a bowel movement. Difficult bowel movements (constipation) can lead to this.  Constant coughing. This may be caused by smoking or lung disease.  Being overweight.  Being pregnant.  Working at a job that requires long periods of standing or heavy lifting.  Having had an inguinal hernia before. One type can be an emergency situation. It is called a strangulated inguinal hernia. It develops if part of the small intestine slips through the weak spot and cannot get back into the abdomen. The blood supply can be cut off. If that happens, part of the intestine may die. This situation requires emergency surgery. SYMPTOMS  Often, a small inguinal hernia has no symptoms. It is found when a healthcare provider does a physical exam. Larger hernias usually have symptoms.   In adults, symptoms may include:  A lump in the groin. This is easier to see when the person is standing. It might disappear when lying down.  In men, a lump in the scrotum.  Pain or burning in the groin. This occurs especially when lifting, straining or coughing.  A dull ache or feeling of pressure in the groin.  Signs of a strangulated hernia can include:  A bulge in the groin that becomes very painful and tender to the touch.  A bulge that turns red or purple.  Fever, nausea and vomiting.  Inability to have a bowel movement or to pass gas. DIAGNOSIS  To  decide if you have an inguinal hernia, a healthcare provider will probably do a physical examination.  This will include asking questions about any symptoms you have noticed.  The healthcare provider might feel the groin area and ask you to cough. If an inguinal hernia is felt, the healthcare provider may try to slide it back into the abdomen.  Usually no other tests are needed. TREATMENT  Treatments can vary. The size of the hernia makes a difference. Options include:  Watchful waiting. This is often suggested if the hernia is small and you have had no symptoms.  No medical procedure will be done unless symptoms develop.  You will need to watch closely for symptoms. If any occur, contact your healthcare provider right away.  Surgery. This is used if the hernia is larger or you have symptoms.  Open surgery. This is usually an outpatient procedure (you will not stay overnight in a hospital). An cut (incision) is made through the skin in the groin. The hernia is put back inside the abdomen. The weak area in the muscles is then repaired by herniorrhaphy or hernioplasty. Herniorrhaphy: in this type of surgery, the weak muscles are sewn back together. Hernioplasty: a patch or mesh is used to close the weak area in the abdominal wall.  Laparoscopy. In this procedure, a surgeon makes small incisions. A thin tube with a tiny video  camera (called a laparoscope) is put into the abdomen. The surgeon repairs the hernia with mesh by looking with the video camera and using two long instruments. HOME CARE INSTRUCTIONS   After surgery to repair an inguinal hernia:  You will need to take pain medicine prescribed by your healthcare provider. Follow all directions carefully.  You will need to take care of the wound from the incision.  Your activity will be restricted for awhile. This will probably include no heavy lifting for several weeks. You also should not do anything too active for a few weeks. When  you can return to work will depend on the type of job that you have.  During "watchful waiting" periods, you should:  Maintain a healthy weight.  Eat a diet high in fiber (fruits, vegetables and whole grains).  Drink plenty of fluids to avoid constipation. This means drinking enough water and other liquids to keep your urine clear or pale yellow.  Do not lift heavy objects.  Do not stand for long periods of time.  Quit smoking. This should keep you from developing a frequent cough. SEEK MEDICAL CARE IF:   A bulge develops in your groin area.  You feel pain, a burning sensation or pressure in the groin. This might be worse if you are lifting or straining.  You develop a fever of more than 100.5 F (38.1 C). SEEK IMMEDIATE MEDICAL CARE IF:   Pain in the groin increases suddenly.  A bulge in the groin gets bigger suddenly and does not go down.  For men, there is sudden pain in the scrotum. Or, the size of the scrotum increases.  A bulge in the groin area becomes red or purple and is painful to touch.  You have nausea or vomiting that does not go away.  You feel your heart beating much faster than normal.  You cannot have a bowel movement or pass gas.  You develop a fever of more than 102.0 F (38.9 C).   This information is not intended to replace advice given to you by your health care provider. Make sure you discuss any questions you have with your health care provider.   Document Released: 01/18/2009 Document Revised: 11/24/2011 Document Reviewed: 03/05/2015 Elsevier Interactive Patient Education 2016 [place] Anesthesia, Adult, Care After Refer to this sheet in the next few weeks. These instructions provide you with information on caring for yourself after your procedure. Your health care provider may also give you more specific instructions. Your treatment has been planned according to current medical practices, but problems sometimes occur. Call  your health care provider if you have any problems or questions after your procedure. WHAT TO EXPECT AFTER THE PROCEDURE After the procedure, it is typical to experience:  Sleepiness.  Nausea and vomiting. HOME CARE INSTRUCTIONS  For the first 24 hours after general anesthesia:  Have a responsible person with you.  Do not drive a car. If you are alone, do not take public transportation.  Do not drink alcohol.  Do not take medicine that has not been prescribed by your health care provider.  Do not sign important papers or make important decisions.  You may resume a normal diet and activities as directed by your health care provider.  Change bandages (dressings) as directed.  If you have questions or problems that seem related to general anesthesia, call the hospital and ask for the anesthetist or anesthesiologist on call. SEEK MEDICAL CARE IF:  You have  nausea and vomiting that continue the day after anesthesia.  You develop a rash. SEEK IMMEDIATE MEDICAL CARE IF:   You have difficulty breathing.  You have chest pain.  You have any allergic problems.   This information is not intended to replace advice given to you by your health care provider. Make sure you discuss any questions you have with your health care provider.   Document Released: 12/08/2000 Document Revised: 09/22/2014 Document Reviewed: 12/31/2011 Elsevier Interactive Patient Education [employer].

## 2015-07-19 NOTE — Anesthesia Procedure Notes (Signed)
Procedure Name: Intubation Date/Time: 07/19/2015 1:07 PM Performed by: Noralyn Pick D Pre-anesthesia Checklist: Patient identified, Emergency Drugs available, Suction available and Patient being monitored Patient Re-evaluated:Patient Re-evaluated prior to inductionOxygen Delivery Method: Circle System Utilized Preoxygenation: Pre-oxygenation with 100% oxygen Intubation Type: IV induction Ventilation: Mask ventilation without difficulty Laryngoscope Size: Mac and 4 Tube type: Oral Tube size: 7.5 mm Number of attempts: 1 Airway Equipment and Method: Stylet and Oral airway ([place] noted to be loose) Placement Confirmation: ETT inserted through vocal cords under direct vision,  positive ETCO2 and breath sounds checked- equal and bilateral Secured at: 22 cm Tube secured with: Tape Dental Injury: Teeth and Oropharynx as per pre-operative assessment

## 2015-07-19 NOTE — Transfer of Care (Signed)
Immediate Anesthesia Transfer of Care Note  Patient: Jamie York  Procedure(s) Performed: Procedure(s): LAPAROSCOPIC BILATERAL INGUINAL HERNIA REPAIR  (Bilateral) PRIMARY REPAIR UMBILICAL HERNIA (N/A)  Patient Location: PACU  Anesthesia Type:General  Level of Consciousness: awake, alert  and oriented  Airway & Oxygen Therapy: Patient Spontanous Breathing and Patient connected to face mask oxygen  Post-op Assessment: Report given to RN and Post -op Vital signs reviewed and stable  Post vital signs: Reviewed and stable  Last Vitals:  Filed Vitals:   07/19/15 1142  BP: 115/59  Pulse: 56  Temp: 36.8 C  Resp: 18    Complications: No apparent anesthesia complications

## 2015-07-19 NOTE — Anesthesia Preprocedure Evaluation (Addendum)
Anesthesia Evaluation  Patient identified by MRN, date of birth, ID band Patient awake    Reviewed: Allergy & Precautions, H&P , NPO status , Patient's Chart, lab work & pertinent test results, reviewed documented beta blocker date and time   Airway Mallampati: II  TM Distance: >3 FB Neck ROM: full    Dental  (+) Dental Advisory Given, Caps, Missing All upper front are capped.  Upper side teeth missing:   Pulmonary Current Smoker,    Pulmonary exam normal breath sounds clear to auscultation       Cardiovascular Exercise Tolerance: Good Normal cardiovascular exam+ dysrhythmias  Rhythm:regular Rate:Normal  PVCs on Toprol   Neuro/Psych negative neurological ROS  negative psych ROS   GI/Hepatic negative GI ROS, Neg liver ROS,   Endo/Other  negative endocrine ROS  Renal/GU negative Renal ROS  negative genitourinary   Musculoskeletal   Abdominal   Peds  Hematology negative hematology ROS (+)   Anesthesia Other Findings   Reproductive/Obstetrics negative OB ROS                            Anesthesia Physical Anesthesia Plan  ASA: II  Anesthesia Plan: General   Post-op Pain Management:    Induction: Intravenous  Airway Management Planned: Oral ETT  Additional Equipment:   Intra-op Plan:   Post-operative Plan: Extubation in OR  Informed Consent: I have reviewed the patients History and Physical, chart, labs and discussed the procedure including the risks, benefits and alternatives for the proposed anesthesia with the patient or authorized representative who has indicated his/her understanding and acceptance.   Dental Advisory Given  Plan Discussed with: CRNA and Surgeon  Anesthesia Plan Comments:         Anesthesia Quick Evaluation

## 2015-07-19 NOTE — Op Note (Signed)
07/19/2015  2:38 PM  PATIENT:  Jamie York  64 y.o. male  Patient Care Team: Gregor Hams, MD as PCP - General (Family Medicine)  PRE-OPERATIVE DIAGNOSIS:  Right and Possible Left Inguinal Hernia and Umbilical Hernia  POST-OPERATIVE DIAGNOSIS:    Bilateral Inguinal Hernias (R>L)  Umbilical Hernia  PROCEDURE:    LAPAROSCOPIC BILATERAL INGUINAL HERNIA REPAIR  PRIMARY REPAIR UMBILICAL HERNIA  SURGEON:  Surgeon(s): Michael Boston, MD  ASSISTANT: RN  ANESTHESIA:   Regional ilioinguinal and genitofemoral and spermatic cord nerve blocks with GETA  EBL:     Delay start of Pharmacological VTE agent (>24hrs) due to surgical blood loss or risk of bleeding:  no  DRAINS: NONE  SPECIMEN:   NONE  DISPOSITION OF SPECIMEN:  N/A  COUNTS:  YES  PLAN OF CARE: Discharge to home after PACU  PATIENT DISPOSITION:  PACU - hemodynamically stable.  INDICATION:  Pleasant active male with chronic pain with painful right inguinal hernia.  Probable small left inguinal hernia.  Also small umbilical hernia.  I recommended surgical exploration and repair of hernias found.    The anatomy & physiology of the abdominal wall and pelvic floor was discussed.  The pathophysiology of hernias in the inguinal and pelvic region was discussed.  Natural history risks such as progressive enlargement, pain, incarceration & strangulation was discussed.   Contributors to complications such as smoking, obesity, diabetes, prior surgery, etc were discussed.    I feel the risks of no intervention will lead to serious problems that outweigh the operative risks; therefore, I recommended surgery to reduce and repair the hernia.  I explained laparoscopic techniques with possible need for an open approach.  I noted usual use of mesh to patch and/or buttress hernia repair  Risks such as bleeding, infection, abscess, need for further treatment, heart attack, death, and other risks were discussed.  I noted a good likelihood  this will help address the problem.   Goals of post-operative recovery were discussed as well.  Possibility that this will not correct all symptoms was explained.  I stressed the importance of low-impact activity, aggressive pain control, avoiding constipation, & not pushing through pain to minimize risk of post-operative chronic pain or injury. Possibility of reherniation was discussed.  We will work to minimize complications.     An educational handout further explaining the pathology & treatment options was given as well.  Questions were answered.  The patient expresses understanding & wishes to proceed with surgery.  OR FINDINGS:  Patient with R>L indirect inguinal hernias.  No strong evidence of femoral or obturator hernias.  15 mm umbilical hernia with some preperitoneal and omental fat.  Reducible.  Primarily repaired.   DESCRIPTION:   The patient was identified & brought into the operating room. The patient was positioned supine with arms tucked. SCDs were active during the entire case. The patient underwent general anesthesia without any difficulty.  The abdomen was prepped and draped in a sterile fashion. The patient's bladder was emptied.  A Surgical Timeout confirmed our plan.  I made a transverse incision through the inferior umbilical fold.  I made a small transverse nick through the anterior rectus fascia contralateral to the inguinal hernia side and placed a 0-vicryl stitch through the fascia.  I placed a Hasson trocar into the preperitoneal plane.  Entry was clean.  We induced carbon dioxide insufflation. Camera inspection revealed no injury.  I used a 53mm angled scope to bluntly free the peritoneum off the infraumbilical anterior  abdominal wall.  I created enough of a preperitoneal pocket to place 1mm ports into the right & left mid-abdomen into this preperitoneal cavity.  I focused attention on the right side since that was the dominant hernia side.   I used blunt & focused sharp  dissection to free the peritoneum off the flank and down to the pubic rim.  I freed the anteriolateral bladder wall off the anteriolateral pelvic wall, sparing midline attachments.   I located a swath of peritoneum going into a hernia fascial defect at the internal inguinal ring consistent with an indirect inguinal hernia.  I gradually freed the peritoneal hernia sac off safely and reduced it into the preperitoneal space.  I freed the peritoneum off the spermatic vessels & vas deferens.  I freed peritoneum off the retroperitoneum along the psoas muscle.    I checked & assured hemostasis.   Mild laxity at the femoral foramen not c/w hernia.  No obturator hernia.    I turned attention on the opposite side.  I did dissection in a similar, mirror-image fashion. The patient had a subtle left inguinal hernia.   No evidence of femoral nor obturator hernias     I chose 15x15 cm sheets of ultra-lightweight polypropylene mesh (Ultrapro), one for each side.  I cut a single sigmoid-shaped slit ~6cm from a corner of each mesh.  I placed the meshes into the preperitoneal space & laid them as overlapping diamonds such that at the inferior points, a 6x6 cm corner flap rested in the true anterolateral pelvis, covering the obturator & femoral foramina.   I allowed the bladder to fall back and help tuck the corners of the mesh in.  The medial corners overlapped each other across midline cephalad to the pubic rim.   This provided >2 inch coverage around the hernia.  Because the defects well covered and not particularly large, I did not need tacks to hold the mesh in place  I held the hernia sacs cephalad & evacuated carbon dioxide.  I closed the fascia  With absorbable suture.  I closed the skin using 4-0 monocryl stitch.  Sterile dressings were applied. The patient was extubated & arrived in the PACU in stable condition..  I had discussed postoperative care with the patient in the holding area.   I did discuss operative  findings and postoperative goals / instructions to his signiificant other family as well.  Instructions are written in the chart.  Adin Hector, M.D., F.A.C.S. Gastrointestinal and Minimally Invasive Surgery [facility] [address], [place] [employer], [place] [address] [phone] Main / Paging

## 2015-07-19 NOTE — H&P (View-Only) (Signed)
Jamie York. Jamie York 06/19/2015 4:00 PM Location: [facility] Patient #: [id number] DOB: 08/02/1951 Separated / Language: Cleophus Molt / Race: White Male History of Present Illness Adin Hector MD; 06/19/2015 5:25 PM) The patient is a 64 year old male who presents with an inguinal hernia. Patient sent for surgical consultation by Dr. Georgina Snell for concern of RIGHT inguinal hernia. Pleasant active male. Occasionally smokes cigars. Some intermittent chest pain felt to be noncardiac followed by cardiology. Artery cleared by them. Today with his fiance. Noted some intermittent RIGHT groin discomfort in the past year but noticed a definite bulge a little over a month ago. It is moving down towards the groin. Reducible. He works as a Electrical engineer with moderate activity. Tries to void severe heavy lifting. No prior surgeries. Normally can walk a half hour without difficulty. Has a bowel movement every day. No fevers or chills. Energy level and appetite pretty good. No skin abscesses or MRSA. Because of concern of the RIGHT groin swelling the patient and his fiance discussed with Dr. Georgina Snell. Concern for inguinal hernia. Surgical consultation requested. Other Problems Elbert Ewings, CMA; 06/19/2015 4:00 PM) No pertinent past medical history  Past Surgical History Elbert Ewings, CMA; 06/19/2015 4:00 PM) Knee Surgery Right. Shoulder Surgery Left.  Diagnostic Studies History Elbert Ewings, [place]; 06/19/2015 4:00 PM) Colonoscopy 1-5 years ago  Allergies Elbert Ewings, CMA; 06/19/2015 4:00 PM) No Known Drug Allergies 06/19/2015  Medication History Elbert Ewings, CMA; 06/19/2015 4:01 PM) Hydrocodone-Acetaminophen (10-325MG  Tablet, Oral) Active. Naproxen (250MG  Tablet, Oral) Active. Paxil CR (25MG  Tablet ER 24HR, Oral) Active. Metoprolol Succinate ER (25MG  Tablet ER 24HR, Oral starting next week) Active. Medications Reconciled  Social History Elbert Ewings, [place]; 06/19/2015 4:00  PM) Alcohol use Occasional alcohol use. Caffeine use Coffee. Illicit drug use Remotely quit drug use. Tobacco use Current every day smoker.  Family History Elbert Ewings, [place]; 06/19/2015 4:00 PM) Colon Cancer Mother.     Review of Systems Elbert Ewings CMA; 06/19/2015 4:00 PM) General Not Present- Appetite Loss, Chills, Fatigue, Fever, Night Sweats, Weight Gain and Weight Loss. Skin Not Present- Change in Wart/Mole, Dryness, Hives, Jaundice, New Lesions, Non-Healing Wounds, Rash and Ulcer. HEENT Present- Visual Disturbances and Wears glasses/contact lenses. Not Present- Earache, Hearing Loss, Hoarseness, Nose Bleed, Oral Ulcers, Ringing in the Ears, Seasonal Allergies, Sinus Pain, Sore Throat and Yellow Eyes. Respiratory Not Present- Bloody sputum, Chronic Cough, Difficulty Breathing, Snoring and Wheezing. Breast Not Present- Breast Mass, Breast Pain, Nipple Discharge and Skin Changes. Cardiovascular Not Present- Chest Pain, Difficulty Breathing Lying Down, Leg Cramps, Palpitations, Rapid Heart Rate, Shortness of Breath and Swelling of Extremities. Gastrointestinal Present- Abdominal Pain. Not Present- Bloating, Bloody Stool, Change in Bowel Habits, Chronic diarrhea, Constipation, Difficulty Swallowing, Excessive gas, Gets full quickly at meals, Hemorrhoids, Indigestion, Nausea, Rectal Pain and Vomiting. Male Genitourinary Not Present- Blood in Urine, Change in Urinary Stream, Frequency, Impotence, Nocturia, Painful Urination, Urgency and Urine Leakage. Musculoskeletal Present- Joint Pain. Not Present- Back Pain, Joint Stiffness, Muscle Pain, Muscle Weakness and Swelling of Extremities. Neurological Not Present- Decreased Memory, Fainting, Headaches, Numbness, Seizures, Tingling, Tremor, Trouble walking and Weakness. Psychiatric Not Present- Anxiety, Bipolar, Change in Sleep Pattern, Depression, Fearful and Frequent crying. Endocrine Not Present- Cold Intolerance, Excessive Hunger, Hair  Changes, Heat Intolerance, Hot flashes and New Diabetes. Hematology Not Present- Easy Bruising, Excessive bleeding, Gland problems, HIV and Persistent Infections.  Vitals Elbert Ewings CMA; 06/19/2015 4:02 PM) 06/19/2015 4:01 PM Weight: 179.8 lb Height: 72in Body Surface Area: 2.04 m Body Mass  Index: 24.39 kg/m Temp.: 98.71F(Temporal)  Pulse: 70 (Regular)  BP: 132/72 (Sitting, Left Arm, Standard)     Physical Exam Adin Hector MD; 06/19/2015 4:31 PM)  General Mental Status-Alert. General Appearance-Not in acute distress, Not Sickly. Orientation-Oriented X3. Hydration-Well hydrated. Voice-Normal.  Integumentary Global Assessment Upon inspection and palpation of skin surfaces of the - Axillae: non-tender, no inflammation or ulceration, no drainage. and Distribution of scalp and body hair is normal. General Characteristics Temperature - normal warmth is noted.  Head and Neck Head-normocephalic, atraumatic with no lesions or palpable masses. Face Global Assessment - atraumatic, no absence of expression. Neck Global Assessment - no abnormal movements, no bruit auscultated on the right, no bruit auscultated on the left, no decreased range of motion, non-tender. Trachea-midline. Thyroid Gland Characteristics - non-tender.  Eye Eyeball - Left-Extraocular movements intact, No Nystagmus. Eyeball - Right-Extraocular movements intact, No Nystagmus. Cornea - Left-No Hazy. Cornea - Right-No Hazy. Sclera/Conjunctiva - Left-No scleral icterus, No Discharge. Sclera/Conjunctiva - Right-No scleral icterus, No Discharge. Pupil - Left-Direct reaction to light normal. Pupil - Right-Direct reaction to light normal. 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 Right - no drainage observed, no generalized tenderness observed. Nose  and Sinuses External Inspection of the Nose - no destructive lesion observed. Inspection of the nares - Left - quiet respiration. Right - quiet respiration. Mouth and Throat Lips - Upper Lip - no fissures observed, no pallor noted. Lower Lip - no fissures observed, no pallor noted. Nasopharynx - no discharge present. Oral Cavity/Oropharynx - Tongue - no dryness observed. Oral Mucosa - no cyanosis observed. Hypopharynx - no evidence of airway distress observed.  Chest and Lung Exam Inspection Movements - Normal and Symmetrical. Accessory muscles - No use of accessory muscles in breathing. Palpation Palpation of the chest reveals - Non-tender. Auscultation Breath sounds - Normal and Clear.  Cardiovascular Auscultation Rhythm - Regular. Murmurs & Other Heart Sounds - Auscultation of the heart reveals - No Murmurs and No Systolic Clicks.  Abdomen Inspection Inspection of the abdomen reveals - No Visible peristalsis and No Abnormal pulsations. Umbilicus - No Bleeding, No Urine drainage. Palpation/Percussion Palpation and Percussion of the abdomen reveal - Soft, Non Tender, No Rebound tenderness, No Rigidity (guarding) and No Cutaneous hyperesthesia. Note: Soft and flat. No diastases. Small 10 x 5 mm umbilical hernia   Male Genitourinary Sexual Maturity Tanner 5 - Adult hair pattern and Adult penile size and shape. Note: Circumcised male. Testes epididymides cords normal. Obvious RIGHT groin bulge reducible consistent with inguinal hernia. Near-normal subtle impulse on LEFT side with minimal sensitivity.   Peripheral Vascular Upper Extremity Inspection - Left - No Cyanotic nailbeds, Not Ischemic. Right - No Cyanotic nailbeds, Not Ischemic.  Neurologic Neurologic evaluation reveals -normal attention span and ability to concentrate, able to name objects and repeat phrases. Appropriate fund of knowledge , normal sensation and normal coordination. Mental Status Affect - not angry, not  paranoid. Cranial Nerves-Normal Bilaterally. Gait-Normal.  Neuropsychiatric Mental status exam performed with findings of-able to articulate well with normal speech/language, rate, volume and coherence, thought content normal with ability to perform basic computations and apply abstract reasoning and no evidence of hallucinations, delusions, obsessions or homicidal/suicidal ideation.  Musculoskeletal Global Assessment Spine, Ribs and Pelvis - no instability, subluxation or laxity. Right Upper Extremity - no instability, subluxation or laxity.  Lymphatic Head & Neck  General Head & Neck  Lymphatics: Bilateral - Description - No Localized lymphadenopathy. Axillary  General Axillary Region: Bilateral - Description - No Localized lymphadenopathy. Femoral & Inguinal  Generalized Femoral & Inguinal Lymphatics: Left - Description - No Localized lymphadenopathy. Right - Description - No Localized lymphadenopathy.    Assessment & Plan Adin Hector MD; 06/19/2015 4:33 PM)  RIGHT INGUINAL HERNIA (K40.90) Impression: Definite RIGHT inguinal hernia. Perhaps mild LEFT inguinal hernias well. Moderately active male with moderate to heavy duty. Think he would benefit from surgical repair. He agrees. He is interesting having the other side check to be sure.  Reasonable laparoscopic approach.  Current Plans You are being scheduled for surgery - Our schedulers will call you.  You should hear from our office's scheduling department within 5 working days about the location, date, and time of surgery. We try to make accommodations for patient's preferences in scheduling surgery, but sometimes the OR schedule or the surgeon's schedule prevents [place] from making those accommodations.  If you have not heard from our office [phone] in 5 working days, call the office and ask for your surgeon's nurse.  If you have other questions about your diagnosis, plan, or surgery, call the office and ask  for your surgeon's nurse. Pt Education - Pamphlet Given - Laparoscopic Hernia Repair: discussed with patient and provided information.   The anatomy & physiology of the abdominal wall and pelvic floor was discussed. The pathophysiology of hernias in the inguinal and pelvic region was discussed. Natural history risks such as progressive enlargement, pain, incarceration, and strangulation was discussed. Contributors to complications such as smoking, obesity, diabetes, prior surgery, etc were discussed.  I feel the risks of no intervention will lead to serious problems that outweigh the operative risks; therefore, I recommended surgery to reduce and repair the hernia. I explained laparoscopic techniques with possible need for an open approach. I noted usual use of mesh to patch and/or buttress hernia repair  Risks such as bleeding, infection, abscess, need for further treatment, heart attack, death, and other risks were discussed. I noted a good likelihood this will help address the problem. Goals of post-operative recovery were discussed as well. Possibility that this will not correct all symptoms was explained. I stressed the importance of low-impact activity, aggressive pain control, avoiding constipation, & not pushing through pain to minimize risk of post-operative chronic pain or injury. Possibility of reherniation was discussed. We will work to minimize complications.  An educational handout further explaining the pathology & treatment options was given as well. Questions were answered. The patient expresses understanding & wishes to proceed with surgery. Pt Education - [facility] Hernia Post-Op HCI (Shanen Norris): discussed with patient and provided information. Pt Education - [facility] Pain Control (Delani Kohli) UMBILICAL HERNIA WITHOUT OBSTRUCTION AND WITHOUT GANGRENE (K42.9) Impression: Think it is small enough that it can be closed primarily. He agrees with getting it fixed while I am there.  Current Plans   The  anatomy & physiology of the abdominal wall was discussed. The pathophysiology of hernias was discussed. Natural history risks without surgery including progeressive enlargement, pain, incarceration, & strangulation was discussed. Contributors to complications such as smoking, obesity, diabetes, prior surgery, etc were discussed.  I feel the risks of no intervention will lead to serious problems that outweigh the operative risks; therefore, I recommended surgery to reduce and repair the hernia. I explained laparoscopic techniques with possible need for an open approach. I noted the probable use of mesh to patch and/or buttress the hernia repair  Risks such as  bleeding, infection, abscess, need for further treatment, heart attack, death, and other risks were discussed. I noted a good likelihood this will help address the problem. Goals of post-operative recovery were discussed as well. Possibility that this will not correct all symptoms was explained. I stressed the importance of low-impact activity, aggressive pain control, avoiding constipation, & not pushing through pain to minimize risk of post-operative chronic pain or injury. Possibility of reherniation especially with smoking, obesity, diabetes, immunosuppression, and other health conditions was discussed. We will work to minimize complications.  An educational handout further explaining the pathology & treatment options was given as well. Questions were answered. The patient expresses understanding & wishes to proceed with surgery.  STOP SMOKING! We talked to the patient about the dangers of smoking.  We stressed that tobacco use dramatically increases the risk of peri-operative complications such as infection, tissue necrosis leaving to problems with incision/wound and organ healing, hernia, chronic pain, heart attack, stroke, DVT, pulmonary embolism, and death.  We noted there are programs in our community to help stop smoking.  Information was  available.  Adin Hector, M.D., F.A.C.S. Gastrointestinal and Minimally Invasive Surgery [facility] [address], [place], [place] [address] [phone] Main / Paging

## 2015-07-19 NOTE — Anesthesia Postprocedure Evaluation (Signed)
  Anesthesia Post-op Note  Patient: Jamie York  Procedure(s) Performed: Procedure(s) (LRB): LAPAROSCOPIC BILATERAL INGUINAL HERNIA REPAIR  (Bilateral) PRIMARY REPAIR UMBILICAL HERNIA (N/A)  Patient Location: PACU  Anesthesia Type: General  Level of Consciousness: awake and alert   Airway and Oxygen Therapy: Patient Spontanous Breathing  Post-op Pain: mild  Post-op Assessment: Post-op Vital signs reviewed, Patient's Cardiovascular Status Stable, Respiratory Function Stable, Patent Airway and No signs of Nausea or vomiting  Last Vitals:  Filed Vitals:   07/19/15 1606  BP: 106/68  Pulse: 57  Temp: 36.4 C  Resp: 16    Post-op Vital Signs: stable   Complications:  Loose upper bridge. Not sure if it was that way prior to surgery.

## 2015-07-19 NOTE — Interval H&P Note (Signed)
History and Physical Interval Note:  07/19/2015 12:21 PM  Jamie York  has presented today for surgery, with the diagnosis of Right and Possible Left Unguinal Hernia and Umbilical Hernia  The various methods of treatment have been discussed with the patient and family. After consideration of risks, benefits and other options for treatment, the patient has consented to  Procedure(s): [place] (N/A) as a surgical intervention .  The patient's history has been reviewed, patient examined, no change in status, stable for surgery.  I have reviewed the patient's chart and labs.  Questions were answered to the patient's satisfaction.     Issaiah Seabrooks C.

## 2015-07-20 ENCOUNTER — Encounter (HOSPITAL_COMMUNITY): Payer: Self-pay | Admitting: Surgery

## 2015-08-02 ENCOUNTER — Ambulatory Visit: Payer: BLUE CROSS/BLUE SHIELD | Admitting: Family Medicine

## 2015-08-08 ENCOUNTER — Other Ambulatory Visit: Payer: Self-pay | Admitting: Family Medicine

## 2015-09-13 ENCOUNTER — Other Ambulatory Visit: Payer: Self-pay | Admitting: Family Medicine

## 2015-09-14 NOTE — Telephone Encounter (Signed)
Pt is requesting a refill on paxil. This is a historical medication. I called to confirm that pt is still taking this medication and his wife stated that he is and has been for years. Please refill if appropriate. Thanks.

## 2015-12-26 ENCOUNTER — Encounter: Payer: Self-pay | Admitting: Family Medicine

## 2015-12-26 ENCOUNTER — Ambulatory Visit (INDEPENDENT_AMBULATORY_CARE_PROVIDER_SITE_OTHER): Payer: BLUE CROSS/BLUE SHIELD | Admitting: Family Medicine

## 2015-12-26 VITALS — BP 120/86 | HR 77 | Wt 182.0 lb

## 2015-12-26 DIAGNOSIS — F329 Major depressive disorder, single episode, unspecified: Secondary | ICD-10-CM

## 2015-12-26 DIAGNOSIS — M79641 Pain in right hand: Secondary | ICD-10-CM

## 2015-12-26 DIAGNOSIS — F32A Depression, unspecified: Secondary | ICD-10-CM

## 2015-12-26 MED ORDER — PAROXETINE HCL ER 25 MG PO TB24: 25.0000 mg | ORAL_TABLET | Freq: Every day | ORAL | Status: DC

## 2015-12-26 NOTE — Patient Instructions (Signed)
Thank you for coming in today. Return in 6-12 months.  Call or go to the ER if you develop a large red swollen joint with extreme pain or oozing puss.   Restart Paxil

## 2015-12-27 DIAGNOSIS — F32A Depression, unspecified: Secondary | ICD-10-CM | POA: Insufficient documentation

## 2015-12-27 DIAGNOSIS — F329 Major depressive disorder, single episode, unspecified: Secondary | ICD-10-CM | POA: Insufficient documentation

## 2015-12-27 NOTE — Assessment & Plan Note (Addendum)
Injection today. Follow-up as needed. Establish problem worsens.

## 2015-12-27 NOTE — Assessment & Plan Note (Signed)
Refill Paxil

## 2015-12-27 NOTE — Progress Notes (Signed)
Jamie York is a 65 y.o. male who presents to [facility] today for hand pain. Patient has a history of pain predominantly in the right second MCP. He had an injection in July 2016 which worked until recently. He denies any subsequent injury. He feels quite well otherwise. Pain is moderate and worse with activity. He's tried some over-the-counter pain medicines which have helped a bit.  Additionally patient notes that he needs refill of his Paxil.   Past Medical History  Diagnosis Date  . Skin cancer 2013  . 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 Dysrhythmia     HX OF FREQUENT PVC'S  . Arthritis   . History of skin cancer   . Inguinal hernia   . Pneumonia     dx  last week,tx. antibiotic- rare cough now., much improved.  . 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 Tonsillectomy    . Rotator cuff repair  2014  . Knee cartilage surgery  1978  . Eye surgery  left eye  . Inguinal hernia repair Bilateral 07/19/2015    Procedure: LAPAROSCOPIC BILATERAL INGUINAL HERNIA REPAIR ;  Surgeon: Michael Boston, MD;  Location: [facility];  Service: General;  Laterality: Bilateral;  . Umbilical hernia repair N/A 07/19/2015    Procedure: PRIMARY REPAIR UMBILICAL HERNIA;  Surgeon: Michael Boston, MD;  Location: [facility];  Service: General;  Laterality: N/A;   Social History  Substance Use Topics  . Smoking status: Current Every Day Smoker  . Smokeless tobacco: Not on file  . Alcohol Use: 4.2 oz/week    7 Cans of beer per week   family history includes Cancer - Colon in his mother.  ROS as above Medications: Current Outpatient Prescriptions  Medication Sig Dispense Refill  . naproxen (NAPROSYN) 500 MG tablet Take 1 tablet (500 mg total) by mouth 2 (two) times daily with a meal. 40 tablet 1  . PARoxetine (PAXIL-CR) 25 MG 24 hr tablet Take 1  tablet (25 mg total) by mouth daily. 90 tablet 1   No current facility-administered medications for this visit.   No Known Allergies   Exam:  BP 120/86 mmHg  Pulse 77  Wt 182 lb (82.555 kg) Gen: Well NAD Right hand normal-appearing without any obvious deformity.  Tender to palpation second MCP. Decreased motion MCP. No triggering. Pulses capillary refill and sensation intact distally.  Procedure: Real-time Ultrasound Guided Injection of right second MCP  Device: GE Logiq E  Images permanently stored and available for review in the ultrasound unit. Verbal informed consent obtained. Discussed risks and benefits of procedure. Warned about infection bleeding damage to structures skin hypopigmentation and fat atrophy among others. Patient expresses understanding and agreement Time-out conducted.  Noted no overlying erythema, induration, or other signs of local infection.  Skin prepped in a sterile fashion.  Local anesthesia: Topical Ethyl chloride.  With sterile technique and under real time ultrasound guidance: 0.5 mL of Marcaine and 80 mg of Depo-Medrol injected easily.  Completed without difficulty  Pain immediately resolved suggesting accurate placement of the medication.  Advised to call if fevers/chills, erythema, induration, drainage, or persistent bleeding.  Images permanently stored and available for review in the ultrasound unit.  Impression: Technically successful ultrasound guided injection.    No results found for this or any previous visit (from the past 24 hour(s)). No results found.  Please see individual assessment and plan sections.

## 2016-02-04 ENCOUNTER — Ambulatory Visit (INDEPENDENT_AMBULATORY_CARE_PROVIDER_SITE_OTHER): Payer: BLUE CROSS/BLUE SHIELD | Admitting: Family Medicine

## 2016-02-04 ENCOUNTER — Encounter: Payer: Self-pay | Admitting: Family Medicine

## 2016-02-04 VITALS — BP 130/82 | HR 74 | Wt 179.0 lb

## 2016-02-04 DIAGNOSIS — M79641 Pain in right hand: Secondary | ICD-10-CM

## 2016-02-04 MED ORDER — NAPROXEN 500 MG PO TABS: 500.0000 mg | ORAL_TABLET | Freq: Two times a day (BID) | ORAL | Status: DC

## 2016-02-04 MED ORDER — HYDROCODONE-ACETAMINOPHEN 5-325 MG PO TABS: 1.0000 | ORAL_TABLET | Freq: Four times a day (QID) | ORAL | Status: DC | PRN

## 2016-02-04 NOTE — Progress Notes (Signed)
Jamie York is a 65 y.o. male who presents to [facility] today for right finger pain. Patient returns to clinic complaining of pain in the right second MCP. He had injection last year which lasted a long time. He was seen a month ago and had repeat injection which did not help much. He never really got much better. He would like repeat injection today because he has chronic daily pain which is difficult to manage with NSAIDs.   Past Medical History  Diagnosis Date  . Skin cancer 2013  . 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 Dysrhythmia     HX OF FREQUENT PVC'S  . Arthritis   . History of skin cancer   . Inguinal hernia   . Pneumonia     dx  last week,tx. antibiotic- rare cough now., much improved.  . 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 Tonsillectomy    . Rotator cuff repair  2014  . Knee cartilage surgery  1978  . Eye surgery  left eye  . Inguinal hernia repair Bilateral 07/19/2015    Procedure: LAPAROSCOPIC BILATERAL INGUINAL HERNIA REPAIR ;  Surgeon: Michael Boston, MD;  Location: [facility];  Service: General;  Laterality: Bilateral;  . Umbilical hernia repair N/A 07/19/2015    Procedure: PRIMARY REPAIR UMBILICAL HERNIA;  Surgeon: Michael Boston, MD;  Location: [facility];  Service: General;  Laterality: N/A;   Social History  Substance Use Topics  . Smoking status: Current Every Day Smoker  . Smokeless tobacco: Not on file  . Alcohol Use: 4.2 oz/week    7 Cans of beer per week   family history includes Cancer - Colon in his mother.  ROS:  No headache, visual changes, nausea, vomiting, diarrhea, constipation, dizziness, abdominal pain, skin rash, fevers, chills, night sweats, weight loss, swollen lymph nodes, body aches, joint swelling, muscle aches, chest pain, shortness of breath, mood changes, visual or auditory hallucinations.    Medications: Current Outpatient  Prescriptions  Medication Sig Dispense Refill  . naproxen (NAPROSYN) 500 MG tablet Take 1 tablet (500 mg total) by mouth 2 (two) times daily with a meal. 60 tablet 1  . PARoxetine (PAXIL-CR) 25 MG 24 hr tablet Take 1 tablet (25 mg total) by mouth daily. 90 tablet 1  . HYDROcodone-acetaminophen (NORCO/VICODIN) 5-325 MG tablet Take 1 tablet by mouth every 6 (six) hours as needed. 15 tablet 0   No current facility-administered medications for this visit.   No Known Allergies   Exam:  BP 130/82 mmHg  Pulse 74  Wt 179 lb (81.194 kg) General: Well Developed, well nourished, and in no acute distress.  Neuro/Psych: Alert and oriented x3,  able to move all 4 extremities, sensation grossly intact. Skin: Warm and dry, no rashes noted.  Respiratory: Not using accessory muscles, speaking in full sentences, trachea midline.  Cardiovascular: Pulses palpable, no extremity edema. Abdomen: Does not appear distended. MSK: Right second MCP swollen tender with decreased motion and pain. No skin erythema.  Procedure: Real-time Ultrasound Guided Injection of right second MCP  Device: GE Logiq E  Images permanently stored and available for review in the ultrasound unit. Verbal informed consent obtained. Discussed risks and benefits of procedure. Warned about infection bleeding damage to structures skin hypopigmentation and fat atrophy among others. Patient expresses understanding and agreement Time-out conducted.  Noted no overlying erythema, induration, or other signs of  local infection.  Skin prepped in a sterile fashion.  Local anesthesia: Topical Ethyl chloride.  With sterile technique and under real time ultrasound guidance: 0.5 mL of Marcaine and 5 mg of dexamethasone injected easily.  Completed without difficulty  Pain immediately resolved suggesting accurate placement of the medication.  Advised to call if fevers/chills, erythema, induration, drainage, or persistent bleeding.  Images  permanently stored and available for review in the ultrasound unit.  Impression: Technically successful ultrasound guided injection.     No results found for this or any previous visit (from the past 24 hour(s)). No results found.  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 Injection today succesful with immediate resolution of pain indicating accurate placement of steroids.

## 2016-02-04 NOTE — Patient Instructions (Addendum)
Thank you for coming in today.   Call or go to the ER if you develop a large red swollen joint with extreme pain or oozing puss.    Return as needed.  

## 2016-05-13 ENCOUNTER — Encounter: Payer: Self-pay | Admitting: Family Medicine

## 2016-05-13 ENCOUNTER — Ambulatory Visit (INDEPENDENT_AMBULATORY_CARE_PROVIDER_SITE_OTHER): Payer: Self-pay | Admitting: Family Medicine

## 2016-05-13 VITALS — BP 130/77 | HR 67 | Temp 97.9°F | Resp 16 | Wt 182.0 lb

## 2016-05-13 DIAGNOSIS — G4750 Parasomnia, unspecified: Secondary | ICD-10-CM | POA: Insufficient documentation

## 2016-05-13 DIAGNOSIS — M79641 Pain in right hand: Secondary | ICD-10-CM

## 2016-05-13 NOTE — Patient Instructions (Signed)
Thank you for coming in today. You should hear from the sleep study people soon.  Call or go to the ER if you develop a large red swollen joint with extreme pain or oozing puss.  Return as needed.   Sleep Studies A sleep study (polysomnogram) is a series of tests done while you are sleeping. It can show how well you sleep. This can help your health care provider diagnose a sleep disorder and show how severe your sleep disorder is. A sleep study may lead to treatment that will help you sleep better and prevent other medical problems caused by poor sleep. If you have a sleep disorder, you may also be at risk for:   Sleep-related accidents.  High blood pressure.  Heart disease.  Stroke.  Other medical conditions. Sleep disorders are common. Your health care provider may suspect a sleep disorder if you:  Have loud snoring most nights.  Have brief periods when you stop breathing at night.  Feel sleepy on most days.  Fall asleep suddenly during the day.  Have trouble falling asleep or staying asleep.  Feel like you need to move your legs when trying to fall asleep.  Have dreams that seem very real shortly after falling asleep.  Feel like you cannot move when you first wake up. WHICH TESTS WILL I NEED TO HAVE?  Most sleep studies last all night and include these tests:  Recordings of your brain activity.  Recordings of your eye movements.  Recording of your heart rate and rhythm.  Blood pressure readings.  Readings of the amount of oxygen in your blood.  Measurements of your chest and belly movement as you breathe during sleep. If you have signs of the sleep disorder called sleep apnea during your test, you may get a mask to wear for the second half of the night.   The mask provides continuous positive airway pressure (CPAP). This may improve sleep apnea significantly.  You will then have all tests done again with the mask in place to see if your measurements and  recordings change. HOW ARE SLEEP STUDIES DONE? Most sleep studies are done over one full night of sleep.   You will arrive at the study center in the evening and can go home in the morning.  Bring your pajamas and toothbrush.  Do not have caffeine on the day of your sleep study.  Your health care provider will let you know if you need to stop taking any of your regular medicines before the test. To do the tests included in a polysomnogram, you will have:  Round, sticky patches with sensors attached to recording wires (electrodes) placed on your scalp, face, chest, and limbs.  Wires from all the electrodes and sensors run from your bed to a computer. The wires can be taken off and put back on if you need to get out of bed to go to the bathroom.  A sensor placed over your nose to measure airflow.  A finger clip put on one finger to measure your blood oxygen level.  A belt around your belly and a belt around your chest to measure breathing movements. WHERE ARE SLEEP STUDIES DONE?  Sleep studies are done at sleep centers. A sleep center may be inside a hospital, office, or clinic.  The room where you have the study may look like a hospital room or a hotel room. The health care providers doing the study may come in and out of the room during the study.  Most of the time, they will be in another room monitoring your test.  HOW IS INFORMATION FROM SLEEP STUDIES HELPFUL? A polysomnogram can be used along with your medical history and a physical exam to diagnose conditions, such as:  Sleep apnea.  Restless legs syndrome.  Sleep-related seizure disorders.  Sleep-related movement disorders. A medical doctor who specializes in sleep will evaluate your sleep study. The specialist will share the results with your primary health care provider. Treatments based on your sleep study may include:  Improving your sleep habits (sleep hygiene).  Wearing a CPAP mask.  Wearing an oral device at night  to improve breathing and reduce snoring.  Taking medicine for:  Restless legs syndrome.  Sleep-related seizure disorder.  Sleep-related movement disorder.   This information is not intended to replace advice given to you by your health care provider. Make sure you discuss any questions you have with your health care provider.   Document Released: 03/08/2003 Document Revised: 09/22/2014 Document Reviewed: 11/07/2013 Elsevier Interactive Patient Education [employer].

## 2016-05-13 NOTE — Progress Notes (Signed)
Pt here stated that he has had several injections of right hand.  Wanted to discuss alternative treatment.

## 2016-05-14 NOTE — Progress Notes (Signed)
Jamie York is a 65 y.o. male who presents to [facility] today for right hand pain. Patient continues to experience pain in the right hand especially at the second digit. He said injections in the past that worked well for a few months. He notes the pain returned recently. He last had an injection to the right second MCP in May. Pain is moderate and associated with stiffness. Pain interferes ability to work normally with his right hand. He is right-hand dominant.  Additionally his wife knows that Jamie York has a rhythmic jerking in his right arm when he is falling asleep. This occurs every few seconds and lasts a few hours. Jamie York is not particularly bothered by it however his wife is concerned this represents some other neurological etiology. Jamie York thinks perhaps he has restless leg syndrome as he does note a creepy crawley skin on his legs when he tries to fall asleep. He denies any weakness or numbness loss of function difficulty with motion or coordination. His wife denies any memory or personality changes.   Past Medical History:  Diagnosis Date  . Arthritis   . 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 Dysrhythmia    HX OF FREQUENT PVC'S  . History of skin cancer   . Inguinal hernia   . Pneumonia    dx  last week,tx. antibiotic- rare cough now., much improved.  . Skin cancer 2013  . 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 EYE SURGERY  left eye  . INGUINAL HERNIA REPAIR Bilateral 07/19/2015   Procedure: LAPAROSCOPIC BILATERAL INGUINAL HERNIA REPAIR ;  Surgeon: Michael Boston, MD;  Location: [facility];  Service: General;  Laterality: Bilateral;  . [place]  . ROTATOR CUFF REPAIR  2014  . TONSILLECTOMY    . UMBILICAL HERNIA REPAIR N/A 07/19/2015   Procedure: PRIMARY REPAIR  UMBILICAL HERNIA;  Surgeon: Michael Boston, MD;  Location: [facility];  Service: General;  Laterality: N/A;   Social History  Substance Use Topics  . Smoking status: Current Every Day Smoker  . Smokeless tobacco: Not on file  . Alcohol use 4.2 oz/week    7 Cans of beer per week   family history includes Cancer - Colon in his mother.  ROS as above:  Medications: Current Outpatient Prescriptions  Medication Sig Dispense Refill  . HYDROcodone-acetaminophen (NORCO/VICODIN) 5-325 MG tablet Take 1 tablet by mouth every 6 (six) hours as needed. 15 tablet 0  . naproxen (NAPROSYN) 500 MG tablet Take 1 tablet (500 mg total) by mouth 2 (two) times daily with a meal. 60 tablet 1  . PARoxetine (PAXIL-CR) 25 MG 24 hr tablet Take 1 tablet (25 mg total) by mouth daily. 90 tablet 1   No current facility-administered medications for this visit.    No Known Allergies   Exam:  BP 130/77 (BP Location: Right Arm, Patient Position: Sitting, Cuff Size: Normal)   Pulse 67   Temp 97.9 F (36.6 C) (Oral)   Resp 16   Wt 182 lb 0.6 oz (82.6 kg)   SpO2 100%   BMI 25.39 kg/m  Gen: Well NAD HEENT: EOMI right eye,  MMM Lungs: Normal work of breathing. CTABL Heart: RRR no MRG Abd: NABS, Soft. Nondistended, Nontender Exts: Brisk capillary refill, warm and well perfused.  Right hand: Swelling mildly tender second MCP  and PIP. Decreased flexion due to pain. No skin erythema. Neuro: Alert and oriented normal coordination speech thought process. No cogwheeling. Normal cerebellar testing. Normal gait and balance. Normal facial expression and motion.  Procedure: Real-time Ultrasound Guided Injection of right second MCP  Device: GE Logiq E  Images permanently stored and available for review in the ultrasound unit. Verbal informed consent obtained. Discussed risks and benefits of procedure. Warned about infection bleeding damage to structures skin hypopigmentation and fat atrophy among others. Patient expresses  understanding and agreement Time-out conducted.  Noted no overlying erythema, induration, or other signs of local infection.  Skin prepped in a sterile fashion.  Local anesthesia: Topical Ethyl chloride.  With sterile technique and under real time ultrasound guidance: 0.25 mL of Marcaine and 20 mg Depo-Medrol injected easily.  Completed without difficulty  Pain immediately resolved suggesting accurate placement of the medication.  Advised to call if fevers/chills, erythema, induration, drainage, or persistent bleeding.  Images permanently stored and available for review in the ultrasound unit.  Impression: Technically successful ultrasound guided injection.  Procedure: Real-time Ultrasound Guided Injection of right second PIP  Device: GE Logiq E  Images permanently stored and available for review in the ultrasound unit. Verbal informed consent obtained. Discussed risks and benefits of procedure. Warned about infection bleeding damage to structures skin hypopigmentation and fat atrophy among others. Patient expresses understanding and agreement Time-out conducted.  Noted no overlying erythema, induration, or other signs of local infection.  Skin prepped in a sterile fashion.  Local anesthesia: Topical Ethyl chloride.  With sterile technique and under real time ultrasound guidance: 0.1 mL of Marcaine and 10 mg of Depo-Medrol injected easily.  Completed without difficulty  Pain immediately resolved suggesting accurate placement of the medication.  Advised to call if fevers/chills, erythema, induration, drainage, or persistent bleeding.  Images permanently stored and available for review in the ultrasound unit.  Impression: Technically successful ultrasound guided injection.  Lot number for injections: Marcaine [id number] Depo-Medrol PAA [id number]   X-ray right hand dated 04/05/2015 reviewed: CLINICAL DATA:  Right hand pain without reported injury.  Initial encounter.  EXAM: RIGHT HAND - COMPLETE 3+ VIEW  COMPARISON:  None.  FINDINGS: No acute fracture or dislocation is noted. Mild narrowing of the second metacarpophalangeal joint is noted. There there appears to be old fracture involving the scaphoid with sclerotic margins. Small linear foreign body is noted in the soft tissues dorsal to the second distal phalanx.  IMPRESSION: Probable old scaphoid fracture. Degenerative change involving the second metacarpophalangeal joint. Possible small linear foreign body seen in soft tissues around second distal phalanx. No acute fracture or dislocation is noted.   Electronically Signed   By: Marijo Conception, M.D.   On: 04/05/2015 13:22    No results found for this or any previous visit (from the past 24 hour(s)). No results found.    Assessment and Plan: 65 y.o. 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 Plan to continue serial injections as needed as well as topical Aspercreme and oral NSAIDs. 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 Plan for sleep study.   Orders Placed This Encounter  Procedures  . Nocturnal polysomnography (NPSG)    Standing Status:   Future    Standing Expiration Date:   05/13/2017    Order Specific Question:   Where should this test be performed:    Answer:   [facility]    Discussed warning signs or symptoms.  Please see discharge instructions. Patient expresses understanding.

## 2016-06-18 ENCOUNTER — Encounter (HOSPITAL_BASED_OUTPATIENT_CLINIC_OR_DEPARTMENT_OTHER): Payer: BLUE CROSS/BLUE SHIELD

## 2016-07-20 ENCOUNTER — Other Ambulatory Visit: Payer: Self-pay | Admitting: Family Medicine

## 2016-08-14 ENCOUNTER — Encounter: Payer: Self-pay | Admitting: Family Medicine

## 2016-08-18 ENCOUNTER — Encounter (HOSPITAL_BASED_OUTPATIENT_CLINIC_OR_DEPARTMENT_OTHER): Payer: BLUE CROSS/BLUE SHIELD

## 2016-10-02 DIAGNOSIS — H11422 Conjunctival edema, left eye: Secondary | ICD-10-CM | POA: Diagnosis not present

## 2016-10-02 DIAGNOSIS — H11423 Conjunctival edema, bilateral: Secondary | ICD-10-CM | POA: Diagnosis not present

## 2016-11-06 DIAGNOSIS — S0522XS Ocular laceration and rupture with prolapse or loss of intraocular tissue, left eye, sequela: Secondary | ICD-10-CM | POA: Diagnosis not present

## 2017-01-19 ENCOUNTER — Ambulatory Visit (INDEPENDENT_AMBULATORY_CARE_PROVIDER_SITE_OTHER): Payer: Medicare Other

## 2017-01-19 ENCOUNTER — Ambulatory Visit (INDEPENDENT_AMBULATORY_CARE_PROVIDER_SITE_OTHER): Payer: Medicare Other | Admitting: Family Medicine

## 2017-01-19 DIAGNOSIS — M25511 Pain in right shoulder: Secondary | ICD-10-CM | POA: Diagnosis not present

## 2017-01-19 NOTE — Progress Notes (Signed)
Jamie York is a 66 y.o. male who presents to [facility] today for right shoulder pain. Patient notes a few weeks history of right shoulder pain. The pain is diffuse located in the lateral upper arm and superior shoulder. Pain is worse with overhead motion and reaching back. The pain is also bad at night. He's been trying naproxen which does help some.   Past Medical History:  Diagnosis Date  . Arthritis   . 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 Dysrhythmia    HX OF FREQUENT PVC'S  . History of skin cancer   . Inguinal hernia   . Pneumonia    dx  last week,tx. antibiotic- rare cough now., much improved.  . Skin cancer 2013  . 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 EYE SURGERY  left eye  . INGUINAL HERNIA REPAIR Bilateral 07/19/2015   Procedure: LAPAROSCOPIC BILATERAL INGUINAL HERNIA REPAIR ;  Surgeon: Michael Boston, MD;  Location: [facility];  Service: General;  Laterality: Bilateral;  . [place]  . ROTATOR CUFF REPAIR  2014  . TONSILLECTOMY    . UMBILICAL HERNIA REPAIR N/A 07/19/2015   Procedure: PRIMARY REPAIR UMBILICAL HERNIA;  Surgeon: Michael Boston, MD;  Location: [facility];  Service: General;  Laterality: N/A;   Social History  Substance Use Topics  . Smoking status: Current Every Day Smoker  . Smokeless tobacco: Not on file  . Alcohol use 4.2 oz/week    7 Cans of beer per week     ROS:  As above   Medications: Current Outpatient Prescriptions  Medication Sig Dispense Refill  . HYDROcodone-acetaminophen (NORCO/VICODIN) 5-325 MG tablet Take 1 tablet by mouth every 6 (six) hours as needed. 15 tablet 0  . naproxen (NAPROSYN) 500 MG tablet TAKE 1 TABLET BY MOUTH TWICE A DAY WITH A MEAL 60 tablet 1  . PARoxetine (PAXIL-CR) 25 MG 24 hr tablet Take 1 tablet (25 mg total) by mouth daily. 90 tablet 1   No current facility-administered medications for  this visit.    No Known Allergies   Exam:  BP 130/71 (BP Location: Left Arm, Patient Position: Sitting, Cuff Size: Normal)   Pulse 66   Temp 97.7 F (36.5 C) (Oral)   Wt 191 lb (86.6 kg)   SpO2 96%   BMI 26.64 kg/m  General: Well Developed, well nourished, and in no acute distress.  Neuro/Psych: Alert and oriented x3, extra-ocular muscles intact, able to move all 4 extremities, sensation grossly intact. Skin: Warm and dry, no rashes noted.  Respiratory: Not using accessory muscles, speaking in full sentences, trachea midline.  Cardiovascular: Pulses palpable, no extremity edema. Abdomen: Does not appear distended. MSK: Right shoulder normal-appearing. Tender palpation overlying the acromioclavicular joint. Range of motion: Abduction full External rotation full Internal rotation limited to the lumbar spine. Positive Hawkins and Neer status. Negative empty can test. Positive crossover arm compression test. Strength is intact throughout.  Procedure: Real-time Ultrasound Guided Injection of right subacromial bursa  Device: GE Logiq E  Images permanently stored and available for review in the ultrasound unit. Verbal informed consent obtained. Discussed risks and benefits of procedure. Warned about infection bleeding damage to structures skin hypopigmentation and fat atrophy among others. Patient expresses understanding and agreement Time-out conducted.  Noted no overlying erythema, induration, or other signs of local infection.  Skin prepped in a sterile fashion.  Local anesthesia: Topical Ethyl chloride.  With sterile technique and under real time ultrasound guidance: 40 mg of Kenalog and 3 mL of Marcaine injected easily.  Completed without difficulty  Pain immediately resolved suggesting accurate placement of the medication.  Advised to call if fevers/chills, erythema, induration, drainage, or persistent bleeding.  Images permanently stored and available for review  in the ultrasound unit.  Impression: Technically successful ultrasound guided injection.     Dg Shoulder Right  Result Date: 01/19/2017 CLINICAL DATA:  Right shoulder pain.  No known injury. EXAM: RIGHT SHOULDER - 2+ VIEW COMPARISON:  None. FINDINGS: Advanced degenerative changes at the right [facility] joint with spurring and joint space narrowing. Glenohumeral joint is maintained. No acute bony abnormality. Specifically, no fracture, subluxation, or dislocation. Soft tissues are intact. IMPRESSION: Advanced degenerative changes at the right [facility] joint. No acute bony abnormality. Electronically Signed   By: Rolm Baptise M.D.   On: 01/19/2017 12:12      Assessment and Plan: 66 y.o. male with  right shoulder pain likely subacromial bursitis and impingement. 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 Plan for injection and home exercise program. Recheck as needed.    Orders Placed This Encounter  Procedures  . DG Shoulder Right    Standing Status:   Future    Number of Occurrences:   1    Standing Expiration Date:   03/21/2018    Order Specific Question:   Reason for Exam (SYMPTOM  OR DIAGNOSIS REQUIRED)    Answer:   eval pain rt shoulder    Order Specific Question:   Preferred imaging location?    Answer:   [facility]    Order Specific Question:   Radiology Contrast Protocol - do NOT remove file path    Answer:   [email or web address]    Discussed warning signs or symptoms. Please see discharge instructions. Patient expresses understanding.

## 2017-01-19 NOTE — Progress Notes (Signed)
Pt is having right shoulder pain x 2 weeks.  Denies injury, level 7 sharp with certain movements.  Has tried naproxen, but it doesn't help.

## 2017-01-19 NOTE — Patient Instructions (Signed)
Thank you for coming in today. Return if needed.  Work on shoulder exercises.    Secondary Shoulder Impingement Syndrome Rehab Ask your health care provider which exercises are safe for you. Do exercises exactly as told by your health care provider and adjust them as directed. It is normal to feel mild stretching, pulling, tightness, or discomfort as you do these exercises, but you should stop right away if you feel sudden pain or your pain gets worse. Do not begin these exercises until told by your health care provider. Stretching and range of motion exercise This exercise warms up your muscles and joints and improves the movement and flexibility of your neck and shoulder. This exercise also helps to relieve pain and stiffness. Exercise A: Cervical side bend   1. Using good posture, sit on a stable chair, or stand up. 2. Without moving your shoulders, slowly tilt your left / right ear toward your left / right shoulder until you feel a stretch in your neck muscles. You should be looking straight ahead. 3. Hold for __________ seconds. 4. Slowly return to the starting position. 5. Repeat on your left / right side. Repeat __________ times. Complete this exercise __________ times a day. Strengthening exercises These exercises build strength and endurance in your shoulder. Endurance is the ability to use your muscles for a long time, even after they get tired. Exercise B: Scapular protraction, supine   1. Lie on your back on a firm surface. Hold a __________ weight in your left / right hand. 2. Raise your left / right arm straight into the air so your hand is directly above your shoulder joint. 3. Push the weight into the air so your shoulder lifts off of the surface that you are lying on. Do not move your head, neck, or back. 4. Hold for __________ seconds. 5. Slowly return to the starting position. Let your muscles relax completely before you repeat this exercise. Repeat __________ times.  Complete this exercise __________ times a day. Exercise C: Scapular retraction   1. Sit in a stable chair without armrests, or stand. 2. Secure an exercise band to a stable object in front of you so the band is at shoulder height. 3. Hold one end of the exercise band in each hand. Your palms should face down. 4. Squeeze your shoulder blades together and move your elbows slightly behind you. Do not shrug your shoulders while you do this. 5. Hold for __________ seconds. 6. Slowly return to the starting position. Repeat __________ times. Complete this exercise __________ times a day. Exercise D: Shoulder extension with scapular retraction   1. Sit in a stable chair without armrests, or stand. 2. Secure an exercise band to a stable object in front of you where it is above shoulder height. 3. Hold one end of the exercise band in each hand. 4. Straighten your elbows and lift your hands up to shoulder height. 5. Squeeze your shoulder blades together and pull your hands down to the sides of your thighs. Stop when your hands are straight down by your sides. Do not let your hands go behind your body. 6. Hold for __________ seconds. 7. Slowly return to the starting position. Repeat __________ times. Complete this exercise __________ times a day. Exercise E: Shoulder abduction  1. Sit in a stable chair without armrests, or stand. 2. If directed, hold a __________ weight in your left / right hand. 3. Start with your arms straight down. Turn your left / right hand so  your palm faces in, toward your body. 4. Slowly lift your left / right hand out to your side. Do not lift your hand above shoulder height.  Keep your arms straight.  Avoid shrugging your shoulder while you do this movement. Keep your shoulder blade tucked down toward the middle of your back. 5. Hold for __________ seconds. 6. Slowly lower your arm, and return to the starting position. Repeat __________ times. Complete this exercise  __________ times a day. This information is not intended to replace advice given to you by your health care provider. Make sure you discuss any questions you have with your health care provider. Document Released: 09/01/2005 Document Revised: 05/08/2016 Document Reviewed: 08/04/2015 Elsevier Interactive Patient Education  2017 [employer].

## 2017-03-03 ENCOUNTER — Ambulatory Visit (INDEPENDENT_AMBULATORY_CARE_PROVIDER_SITE_OTHER): Payer: Medicare Other | Admitting: Family Medicine

## 2017-03-03 VITALS — BP 136/71 | HR 88 | Wt 188.0 lb

## 2017-03-03 DIAGNOSIS — F331 Major depressive disorder, recurrent, moderate: Secondary | ICD-10-CM | POA: Diagnosis not present

## 2017-03-03 DIAGNOSIS — R5383 Other fatigue: Secondary | ICD-10-CM

## 2017-03-03 MED ORDER — PAROXETINE HCL ER 25 MG PO TB24: 25.0000 mg | ORAL_TABLET | Freq: Every day | ORAL | 1 refills | Status: DC

## 2017-03-03 NOTE — Progress Notes (Signed)
Jamie York is a 66 y.o. male who presents to [facility] today for fatigue. Patient notes recent fatigue. He notes that he is moving to [place] and has had significant fatigue associated with this. He notes that with a lot of work. Additionally he thinks his depression may be worsening. He has not been taking his chronic Paxil which she has taken previously which worked well. He notes that he is napping more than usual. He notes it is not sleeping as well as usual. He notes this is a typical symptom for depression for him.   Past Medical History:  Diagnosis Date  . Arthritis   . 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 Dysrhythmia    HX OF FREQUENT PVC'S  . History of skin cancer   . Inguinal hernia   . Pneumonia    dx  last week,tx. antibiotic- rare cough now., much improved.  . Skin cancer 2013  . 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 EYE SURGERY  left eye  . INGUINAL HERNIA REPAIR Bilateral 07/19/2015   Procedure: LAPAROSCOPIC BILATERAL INGUINAL HERNIA REPAIR ;  Surgeon: Michael Boston, MD;  Location: [facility];  Service: General;  Laterality: Bilateral;  . [place]  . ROTATOR CUFF REPAIR  2014  . TONSILLECTOMY    . UMBILICAL HERNIA REPAIR N/A 07/19/2015   Procedure: PRIMARY REPAIR UMBILICAL HERNIA;  Surgeon: Michael Boston, MD;  Location: [facility];  Service: General;  Laterality: N/A;   Social History  Substance Use Topics  . Smoking status: Current Every Day Smoker  . Smokeless tobacco: Not on file  . Alcohol use 4.2 oz/week    7 Cans of beer per week   family history includes Cancer - Colon in his mother.  ROS as above:  Medications: Current Outpatient Prescriptions  Medication Sig Dispense Refill  . HYDROcodone-acetaminophen (NORCO/VICODIN) 5-325 MG tablet Take 1 tablet by mouth  every 6 (six) hours as needed. 15 tablet 0  . naproxen (NAPROSYN) 500 MG tablet TAKE 1 TABLET BY MOUTH TWICE A DAY WITH A MEAL 60 tablet 1  . PARoxetine (PAXIL-CR) 25 MG 24 hr tablet Take 1 tablet (25 mg total) by mouth daily. 90 tablet 1   No current facility-administered medications for this visit.    No Known Allergies  Health Maintenance Health Maintenance  Topic Date Due  . TETANUS/TDAP  08/15/1968  . COLONOSCOPY  10/15/2010  . PNA vac Low Risk Adult (2 of 2 - PPSV23) 04/04/2016  . INFLUENZA VACCINE  04/15/2017  . Hepatitis C Screening  Completed     Exam:  BP 136/71   Pulse 88   Wt 188 lb (85.3 kg)   BMI 26.22 kg/m  Gen: Well NAD HEENT: ,  MMM No goiter Lungs: Normal work of breathing. CTABL Heart: RRR no MRG Abd: NABS, Soft. Nondistended, Nontender Exts: Brisk capillary refill, warm and well perfused.  Psych alert and oriented normal speech thought process. Slightly tearful affect. No SI or HI expressed.  Depression screen PHQ 2/9 03/03/2017  Decreased Interest 3  Down, Depressed, Hopeless 2  PHQ - 2 Score 5  Altered sleeping 2  Tired, decreased energy 2  Change in appetite 0  Feeling bad or failure about yourself  0  Trouble concentrating 0  Moving slowly or fidgety/restless 0  Suicidal thoughts 0  PHQ-9  Score 9   GAD 7 : Generalized Anxiety Score 03/03/2017  Nervous, Anxious, on Edge 0  Control/stop worrying 1  Worry too much - different things 1  Trouble relaxing 0  Restless 0  Easily annoyed or irritable 0  Afraid - awful might happen 0  Total GAD 7 Score 2       No results found for this or any previous visit (from the past 72 hour(s)). No results found.    Assessment and Plan: 66 y.o. 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 Plan for limited metabolic workup listed below. Start treatment with Paxil. Recheck in one month.   Orders Placed This Encounter  Procedures  . CBC  . COMPLETE  METABOLIC PANEL WITH GFR  . TSH  . T4, free  . T3, free   Meds ordered this encounter  Medications  . PARoxetine (PAXIL-CR) 25 MG 24 hr tablet    Sig: Take 1 tablet (25 mg total) by mouth daily.    Dispense:  90 tablet    Refill:  1     Discussed warning signs or symptoms. Please see discharge instructions. Patient expresses understanding.

## 2017-03-03 NOTE — Patient Instructions (Signed)
Thank you for coming in today. Get some labs today.  Restart Paxil.  Recheck in July when you are in town.  Return sooner if needed.    Fatigue Fatigue is feeling tired all of the time, a lack of energy, or a lack of motivation. Occasional or mild fatigue is often a normal response to activity or life in general. However, long-lasting (chronic) or extreme fatigue may indicate an underlying medical condition. Follow these instructions at home: Watch your fatigue for any changes. The following actions may help to lessen any discomfort you are feeling:  Talk to your health care provider about how much sleep you need each night. Try to get the required amount every night.  Take medicines only as directed by your health care provider.  Eat a healthy and nutritious diet. Ask your health care provider if you need help changing your diet.  Drink enough fluid to keep your urine clear or pale yellow.  Practice ways of relaxing, such as yoga, meditation, massage therapy, or acupuncture.  Exercise regularly.  Change situations that cause you stress. Try to keep your work and personal routine reasonable.  Do not abuse illegal drugs.  Limit alcohol intake to no more than 1 drink per day for nonpregnant women and 2 drinks per day for men. One drink equals 12 ounces of beer, 5 ounces of wine, or 1 ounces of hard liquor.  Take a multivitamin, if directed by your health care provider.  Contact a health care provider if:  Your fatigue does not get better.  You have a fever.  You have unintentional weight loss or gain.  You have headaches.  You have difficulty: ? Falling asleep. ? Sleeping throughout the night.  You feel angry, guilty, anxious, or sad.  You are unable to have a bowel movement (constipation).  You skin is dry.  Your legs or another part of your body is swollen. Get help right away if:  You feel confused.  Your vision is blurry.  You feel faint or pass  out.  You have a severe headache.  You have severe abdominal, pelvic, or back pain.  You have chest pain, shortness of breath, or an irregular or fast heartbeat.  You are unable to urinate or you urinate less than normal.  You develop abnormal bleeding, such as bleeding from the rectum, vagina, nose, lungs, or nipples.  You vomit blood.  You have thoughts about harming yourself or committing suicide.  You are worried that you might harm someone else. This information is not intended to replace advice given to you by your health care provider. Make sure you discuss any questions you have with your health care provider. Document Released: 06/29/2007 Document Revised: 02/07/2016 Document Reviewed: 01/03/2014 Elsevier Interactive Patient Education  [employer].

## 2017-03-04 LAB — COMPLETE METABOLIC PANEL WITH GFR
ALBUMIN: 4.1 g/dL (ref 3.6–5.1)
ALT: 22 U/L (ref 9–46)
AST: 18 U/L (ref 10–35)
Alkaline Phosphatase: 61 U/L (ref 40–115)
BUN: 12 mg/dL (ref 7–25)
CO2: 24 mmol/L (ref 20–31)
Calcium: 9.2 mg/dL (ref 8.6–10.3)
Chloride: 109 mmol/L (ref 98–110)
Creat: 0.91 mg/dL (ref 0.70–1.25)
GFR, EST NON AFRICAN AMERICAN: 87 mL/min (ref >=60)
GFR, Est African American: >89 mL/min (ref >=60)
GLUCOSE: 84 mg/dL (ref 65–99)
Potassium: 4 mmol/L (ref 3.5–5.3)
SODIUM: 141 mmol/L (ref 135–146)
Total Bilirubin: 0.6 mg/dL (ref 0.2–1.2)
Total Protein: 6.4 g/dL (ref 6.1–8.1)

## 2017-03-04 LAB — CBC
HCT: 48.6 % (ref 38.5–50.0)
Hemoglobin: 16.9 g/dL (ref 13.2–17.1)
MCH: 33.7 pg — AB (ref 27.0–33.0)
MCHC: 34.8 g/dL (ref 32.0–36.0)
MCV: 97 fL (ref 80.0–100.0)
MPV: 9.6 fL (ref 7.5–12.5)
Platelets: 200 10*3/uL (ref 140–400)
RBC: 5.01 MIL/uL (ref 4.20–5.80)
RDW: 13.8 % (ref 11.0–15.0)
WBC: 8.9 10*3/uL (ref 3.8–10.8)

## 2017-03-04 LAB — TSH: TSH: 1.53 mIU/L (ref 0.40–4.50)

## 2017-03-04 LAB — T3, FREE: T3 FREE: 2.7 pg/mL (ref 2.3–4.2)

## 2017-03-04 LAB — T4, FREE: Free T4: 1.3 ng/dL (ref 0.8–1.8)

## 2017-03-11 ENCOUNTER — Emergency Department (INDEPENDENT_AMBULATORY_CARE_PROVIDER_SITE_OTHER)
Admission: EM | Admit: 2017-03-11 | Discharge: 2017-03-11 | Disposition: A | Discharge status: Home / Self Care | Payer: Medicare Other | Source: Home / Self Care | Attending: Family Medicine | Admitting: Family Medicine

## 2017-03-11 ENCOUNTER — Encounter: Payer: Self-pay | Admitting: Emergency Medicine

## 2017-03-11 DIAGNOSIS — N492 Inflammatory disorders of scrotum: Secondary | ICD-10-CM | POA: Diagnosis not present

## 2017-03-11 MED ORDER — DOXYCYCLINE HYCLATE 100 MG PO CAPS: 100.0000 mg | ORAL_CAPSULE | Freq: Two times a day (BID) | ORAL | 0 refills | Status: AC

## 2017-03-11 NOTE — ED Triage Notes (Signed)
Insect bite on right testicle x 2 days

## 2017-03-11 NOTE — ED Provider Notes (Signed)
CSN: [id number]     Arrival date & time 03/11/17  1194 History   First MD Initiated Contact with Patient 03/11/17 1004     Chief Complaint  Patient presents with  . Insect Bite   (Consider location/radiation/quality/duration/timing/severity/associated sxs/prior Treatment) HPI  Jamie York is a 66 y.o. male presenting to UC with c/o 2 days of gradually worsening Right side testicular erythema, swelling and soreness that is now starting to radiate into Right side of penis.  He reports being in an area with lots of ticks. He does not recall seeing a tick on his scrotum but his wife did notice a darkened area and is concerned there was a tick there. Pain is mild, 1/10.  Denies fever, chills, n/v/d. Denies bleeding or discharge from his penis or the darkened area. Pt is moving to [place] on Friday, 6/29.   Past Medical History:  Diagnosis Date  . Arthritis   . 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 Dysrhythmia    HX OF FREQUENT PVC'S  . History of skin cancer   . Inguinal hernia   . Pneumonia    dx  last week,tx. antibiotic- rare cough now., much improved.  . Skin cancer 2013  . 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 EYE SURGERY  left eye  . INGUINAL HERNIA REPAIR Bilateral 07/19/2015   Procedure: LAPAROSCOPIC BILATERAL INGUINAL HERNIA REPAIR ;  Surgeon: Michael Boston, MD;  Location: [facility];  Service: General;  Laterality: Bilateral;  . [place]  . ROTATOR CUFF REPAIR  2014  . TONSILLECTOMY    . UMBILICAL HERNIA REPAIR N/A 07/19/2015   Procedure: PRIMARY REPAIR UMBILICAL HERNIA;  Surgeon: Michael Boston, MD;  Location: [facility];  Service: General;  Laterality: N/A;   Family History  Problem Relation Age of Onset  . Cancer - Colon Mother    Social History  Substance Use Topics  . Smoking status: Current Every Day Smoker  . Smokeless tobacco: Never Used  . Alcohol use 4.2 oz/week    7 Cans of beer per week      Review of Systems  Constitutional: Negative for chills and fever.  Gastrointestinal: Negative for abdominal pain, diarrhea, nausea and vomiting.  Genitourinary: Positive for penile swelling and scrotal swelling. Negative for discharge, dysuria, hematuria and testicular pain.  Skin: Positive for color change and wound.    Allergies  Patient has no known allergies.  Home Medications   Prior to Admission medications   Medication Sig Start Date End Date Taking? Authorizing Provider  doxycycline (VIBRAMYCIN) 100 MG capsule Take 1 capsule (100 mg total) by mouth 2 (two) times daily. One po bid x 10 days 03/11/17   Noe Gens, PA-C  naproxen (NAPROSYN) 500 MG tablet TAKE 1 TABLET BY MOUTH TWICE A DAY WITH A MEAL 07/21/16   Gregor Hams, MD  PARoxetine (PAXIL-CR) 25 MG 24 hr tablet Take 1 tablet (25 mg total) by mouth daily. 03/03/17   Gregor Hams, MD   Meds Ordered and Administered this Visit  Medications - No data to display  BP 130/79 (BP Location: Left Arm)   Pulse 69   Temp 98.3 F (36.8 C) (Oral)   Ht 5\' 11"  (1.803 m)   Wt 186 lb (84.4 kg)   SpO2 97%   BMI 25.94 kg/m  No data found.   Physical Exam  Constitutional: He is oriented  to person, place, and time. He appears well-developed and well-nourished. No distress.  HENT:  Head: Normocephalic and atraumatic.  Eyes: EOM are normal.  Neck: Normal range of motion.  Cardiovascular: Normal rate.   Pulmonary/Chest: Effort normal.  Genitourinary: Penis normal. Right testis shows swelling and tenderness (minimally). Right testis shows no mass. Left testis shows no mass, no swelling and no tenderness. No penile tenderness.     Genitourinary Comments: Chaperoned exam. Erythema with mild edema and tenderness to Right side of scrotum. 51mm black speck and superficial abrasion on skin. No bleeding or discharge.  Musculoskeletal: Normal range of motion.  Neurological: He is alert and oriented to person, place, and time.   Skin: Skin is warm and dry. He is not diaphoretic.  Psychiatric: He has a normal mood and affect. His behavior is normal.  Nursing note and vitals reviewed.   Urgent Care Course     Procedures (including critical care time)  Labs Review Labs Reviewed - No data to display  Imaging Review No results found.  Area on Right side of scrotum cleaned with saline, black speck gently removed with forceps.    MDM   1. Cellulitis, scrotum    Hx and exam c/w cellulitis of scrotum, possibly from a tick bite. Pt is very low risk for [place] given tick was not in area for more than 1 day at the most, however, will cover for secondary skin infection  Rx: Doxycycline F/u in 2-3 days before he moves if needed, or f/u at an UC in [place] if not improving in 5-6 days (Pt moves on Friday, 6/29)    Noe Gens, PA-C 03/11/17 1038

## 2017-07-12 DIAGNOSIS — Z23 Encounter for immunization: Secondary | ICD-10-CM | POA: Diagnosis not present

## 2017-10-07 ENCOUNTER — Other Ambulatory Visit: Payer: Self-pay | Admitting: Family Medicine

## 2017-11-24 ENCOUNTER — Other Ambulatory Visit: Payer: Self-pay | Admitting: Family Medicine

## 2017-12-01 DIAGNOSIS — M1612 Unilateral primary osteoarthritis, left hip: Secondary | ICD-10-CM | POA: Diagnosis not present

## 2017-12-01 DIAGNOSIS — M7062 Trochanteric bursitis, left hip: Secondary | ICD-10-CM | POA: Diagnosis not present

## 2017-12-01 DIAGNOSIS — M25552 Pain in left hip: Secondary | ICD-10-CM | POA: Diagnosis not present

## 2017-12-09 ENCOUNTER — Other Ambulatory Visit: Payer: Self-pay | Admitting: Family Medicine

## 2018-01-15 DIAGNOSIS — I809 Phlebitis and thrombophlebitis of unspecified site: Secondary | ICD-10-CM | POA: Diagnosis not present

## 2018-01-15 DIAGNOSIS — R5383 Other fatigue: Secondary | ICD-10-CM | POA: Diagnosis not present

## 2018-01-15 DIAGNOSIS — R42 Dizziness and giddiness: Secondary | ICD-10-CM | POA: Diagnosis not present

## 2018-01-15 DIAGNOSIS — F17200 Nicotine dependence, unspecified, uncomplicated: Secondary | ICD-10-CM | POA: Diagnosis not present

## 2018-02-03 DIAGNOSIS — E785 Hyperlipidemia, unspecified: Secondary | ICD-10-CM | POA: Diagnosis not present

## 2018-02-03 DIAGNOSIS — N529 Male erectile dysfunction, unspecified: Secondary | ICD-10-CM | POA: Diagnosis not present

## 2018-02-21 IMAGING — DX DG SHOULDER 2+V*R*
3 series · 3 of 3 positions shown · non-contrast
Comparison: None.

CLINICAL DATA: Right shoulder pain.  No known injury.

EXAM:
RIGHT SHOULDER - 2+ VIEW

[shoulder grashey]
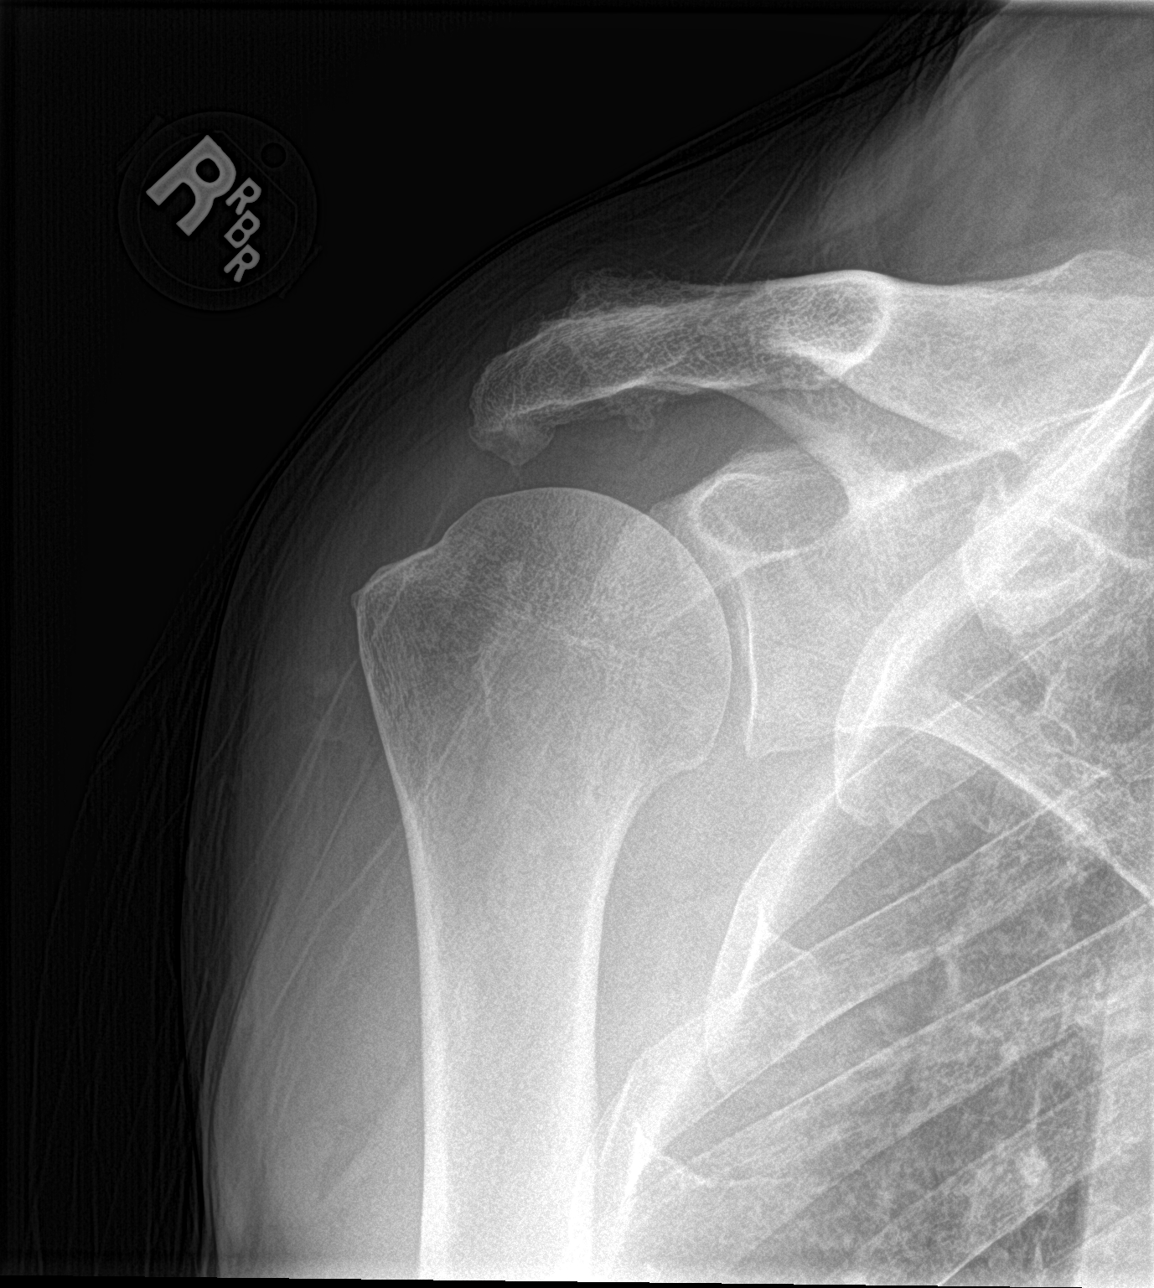

[shoulder y view]
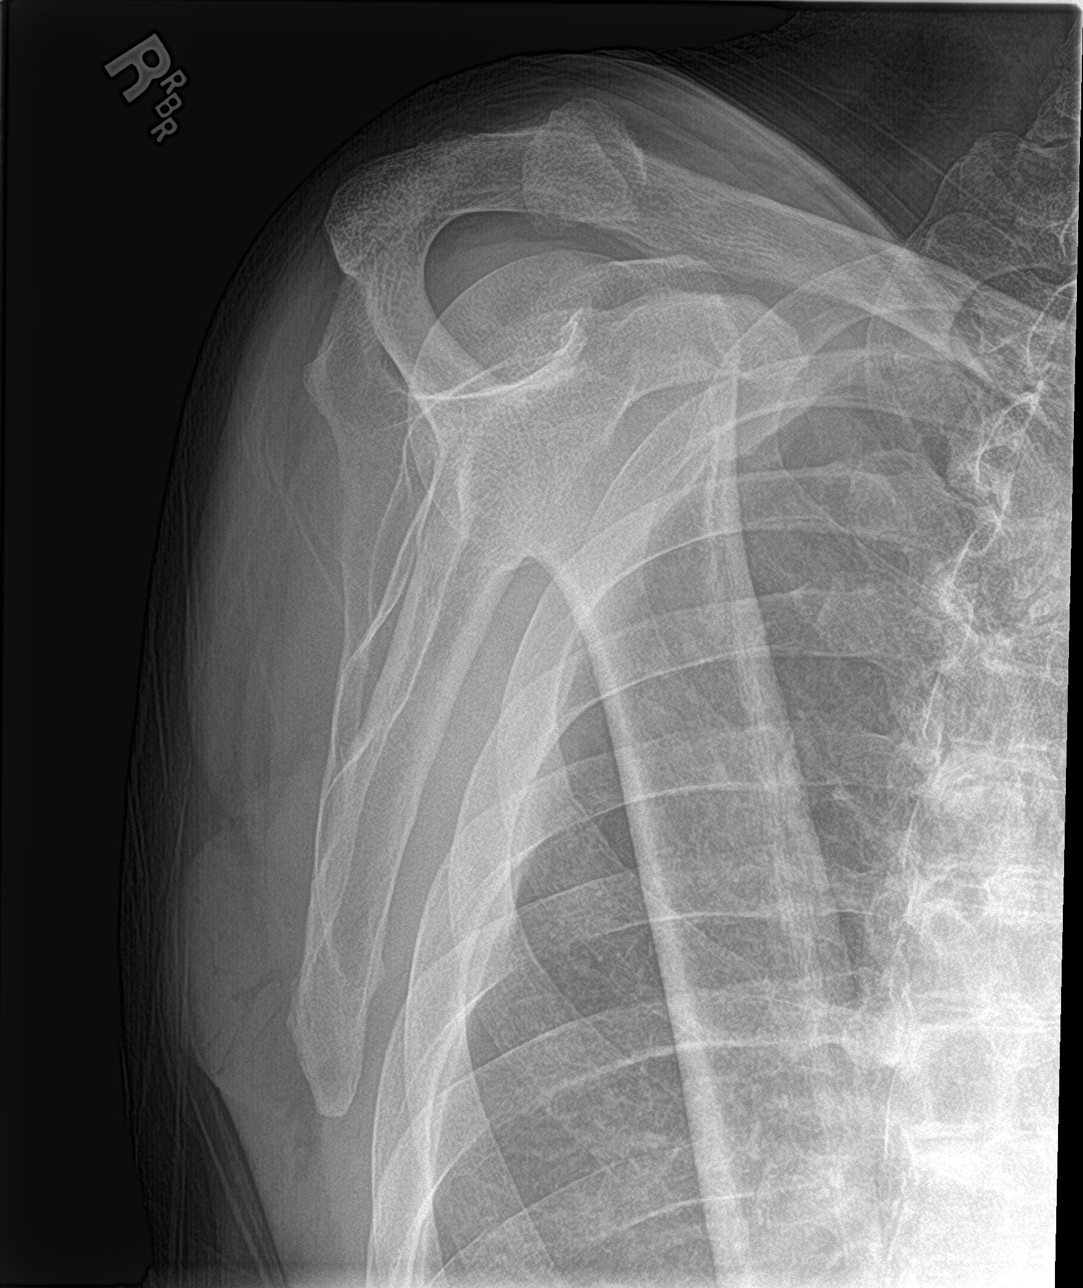

[shoulder axillary]
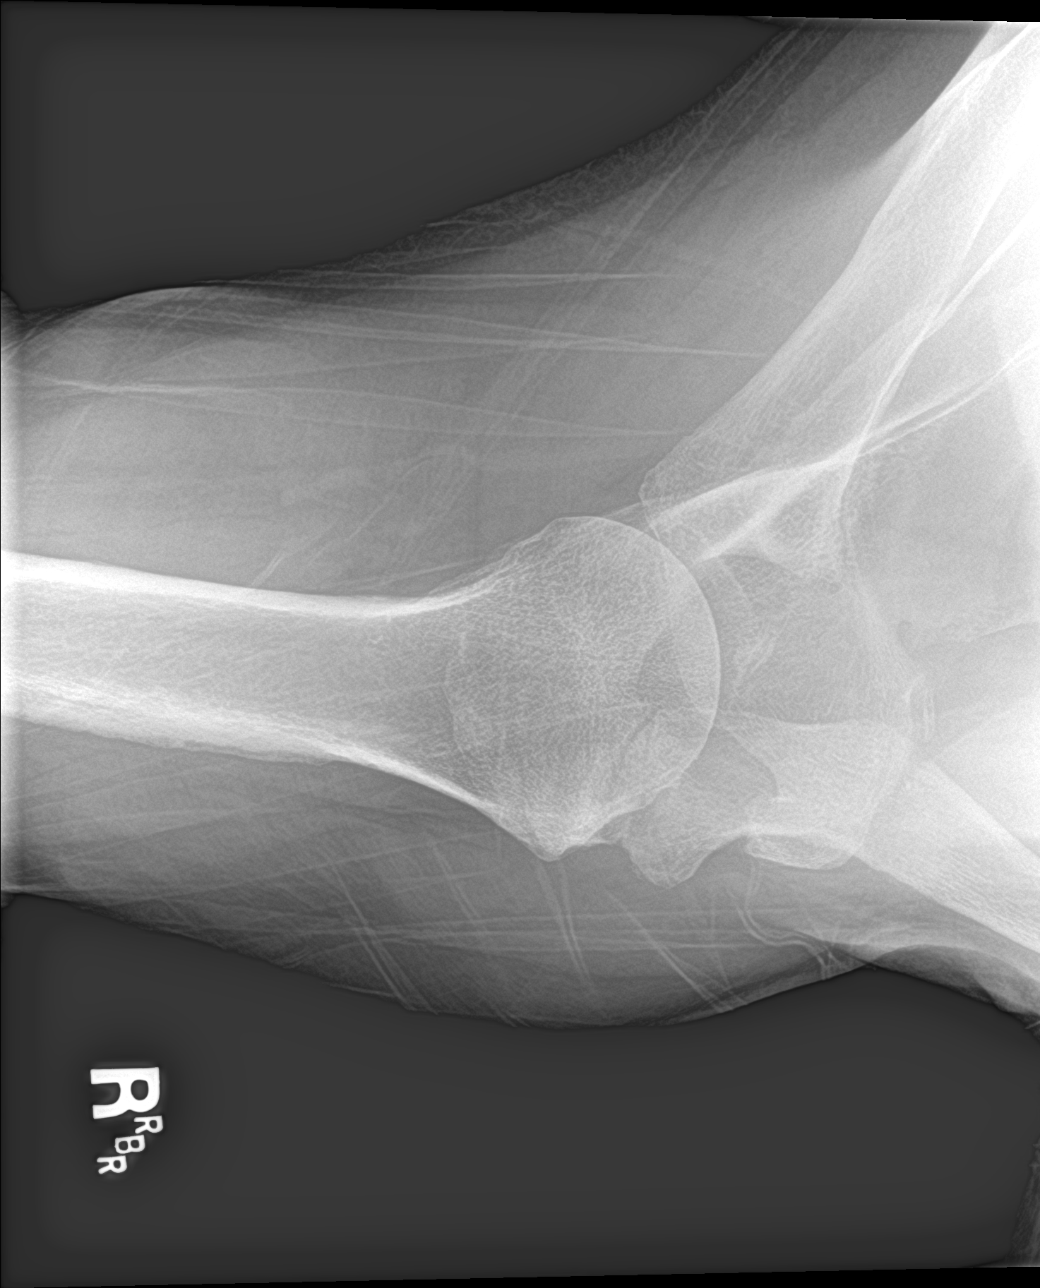

[3 of 3 positions shown; findings below may reference images not displayed]

FINDINGS: Advanced degenerative changes at the right AC joint with spurring
and joint space narrowing. Glenohumeral joint is maintained. No
acute bony abnormality. Specifically, no fracture, subluxation, or
dislocation. Soft tissues are intact.
IMPRESSION: Advanced degenerative changes at the right AC joint. No acute bony
abnormality.

## 2018-03-25 DIAGNOSIS — M1612 Unilateral primary osteoarthritis, left hip: Secondary | ICD-10-CM | POA: Diagnosis not present

## 2018-03-25 DIAGNOSIS — M7062 Trochanteric bursitis, left hip: Secondary | ICD-10-CM | POA: Diagnosis not present

## 2018-04-14 ENCOUNTER — Other Ambulatory Visit: Payer: Self-pay

## 2018-07-26 ENCOUNTER — Encounter: Payer: Self-pay | Admitting: Family Medicine

## 2018-08-02 ENCOUNTER — Telehealth: Payer: Self-pay | Admitting: Family Medicine

## 2018-08-02 NOTE — Telephone Encounter (Signed)
Patient due for flu vaccine and also general recheck.  Please schedule an appointment.

## 2018-08-02 NOTE — Telephone Encounter (Signed)
Left message on patient vm advising to call us back and let us know if he have gotten the flue shot or if he plans to get it. Rhonda Cunningham,CMA

## 2018-08-06 DIAGNOSIS — L720 Epidermal cyst: Secondary | ICD-10-CM | POA: Diagnosis not present

## 2018-08-06 DIAGNOSIS — Z85828 Personal history of other malignant neoplasm of skin: Secondary | ICD-10-CM | POA: Diagnosis not present

## 2018-08-20 DIAGNOSIS — L720 Epidermal cyst: Secondary | ICD-10-CM | POA: Diagnosis not present

## 2018-08-31 ENCOUNTER — Telehealth: Payer: Self-pay | Admitting: Family Medicine

## 2018-08-31 NOTE — Telephone Encounter (Signed)
Left VM for Pt to return clinic call.  

## 2018-08-31 NOTE — Telephone Encounter (Signed)
Our records indicate that you are due for an influenza vaccine. Because of your age group you are at higher risk for dying or being hospitalized from influenza. We can reduce this risk by giving a flu vaccine.   If you have already had a flu vaccine please let me know.   If you have not had a flu vaccine please call [phone] and schedule a nurse appointment. You do not need to see me to get a flu vaccine.   If you have any questions or concerns, please don't hesitate to call.

## 2018-09-23 ENCOUNTER — Encounter: Payer: Self-pay | Admitting: Family Medicine

## 2018-09-24 DIAGNOSIS — Z23 Encounter for immunization: Secondary | ICD-10-CM | POA: Diagnosis not present

## 2018-09-25 DIAGNOSIS — Z23 Encounter for immunization: Secondary | ICD-10-CM | POA: Diagnosis not present

## 2020-10-24 ENCOUNTER — Encounter: Payer: Self-pay | Admitting: Internal Medicine
